# Patient Record
Sex: Male | Born: 1950 | Race: Black or African American | Hispanic: No | Marital: Married | Smoking: Current every day smoker
Health system: Southern US, Community
[De-identification: ages and names within clinical notes are randomized; demographics above are authoritative.]

---

## 2002-03-04 ENCOUNTER — Encounter: Payer: Self-pay | Admitting: Internal Medicine

## 2002-03-04 ENCOUNTER — Emergency Department (HOSPITAL_COMMUNITY): Admission: EM | Admit: 2002-03-04 | Discharge: 2002-03-04 | Payer: Self-pay | Admitting: Internal Medicine

## 2006-02-05 ENCOUNTER — Emergency Department (HOSPITAL_COMMUNITY): Admission: EM | Admit: 2006-02-05 | Discharge: 2006-02-05 | Payer: Self-pay | Admitting: Emergency Medicine

## 2015-03-22 ENCOUNTER — Emergency Department (HOSPITAL_COMMUNITY)
Admission: EM | Admit: 2015-03-22 | Discharge: 2015-03-22 | Disposition: A | Discharge status: Home / Self Care | Payer: Self-pay | Attending: Emergency Medicine | Admitting: Emergency Medicine

## 2015-03-22 ENCOUNTER — Encounter (HOSPITAL_COMMUNITY): Payer: Self-pay | Admitting: Emergency Medicine

## 2015-03-22 DIAGNOSIS — R0981 Nasal congestion: Secondary | ICD-10-CM | POA: Insufficient documentation

## 2015-03-22 DIAGNOSIS — R6889 Other general symptoms and signs: Secondary | ICD-10-CM

## 2015-03-22 DIAGNOSIS — R197 Diarrhea, unspecified: Secondary | ICD-10-CM | POA: Insufficient documentation

## 2015-03-22 DIAGNOSIS — M545 Low back pain, unspecified: Secondary | ICD-10-CM

## 2015-03-22 DIAGNOSIS — R109 Unspecified abdominal pain: Secondary | ICD-10-CM | POA: Insufficient documentation

## 2015-03-22 DIAGNOSIS — R05 Cough: Secondary | ICD-10-CM | POA: Insufficient documentation

## 2015-03-22 DIAGNOSIS — R509 Fever, unspecified: Secondary | ICD-10-CM | POA: Insufficient documentation

## 2015-03-22 DIAGNOSIS — R112 Nausea with vomiting, unspecified: Secondary | ICD-10-CM | POA: Insufficient documentation

## 2015-03-22 DIAGNOSIS — F17210 Nicotine dependence, cigarettes, uncomplicated: Secondary | ICD-10-CM | POA: Insufficient documentation

## 2015-03-22 LAB — COMPREHENSIVE METABOLIC PANEL
ALT: 19 U/L (ref 17–63)
AST: 29 U/L (ref 15–41)
Albumin: 4.1 g/dL (ref 3.5–5.0)
Alkaline Phosphatase: 79 U/L (ref 38–126)
Anion gap: 8 (ref 5–15)
BUN: 14 mg/dL (ref 6–20)
CO2: 27 mmol/L (ref 22–32)
Calcium: 9 mg/dL (ref 8.9–10.3)
Chloride: 98 mmol/L — ABNORMAL LOW (ref 101–111)
Creatinine, Ser: 1.17 mg/dL (ref 0.61–1.24)
GFR calc Af Amer: >60 mL/min (ref >60)
GFR calc non Af Amer: >60 mL/min (ref >60)
Glucose, Bld: 101 mg/dL — ABNORMAL HIGH (ref 65–99)
Potassium: 4 mmol/L (ref 3.5–5.1)
Sodium: 133 mmol/L — ABNORMAL LOW (ref 135–145)
Total Bilirubin: 0.7 mg/dL (ref 0.3–1.2)
Total Protein: 7 g/dL (ref 6.5–8.1)

## 2015-03-22 LAB — CBC
HCT: 43 % (ref 39.0–52.0)
Hemoglobin: 14.4 g/dL (ref 13.0–17.0)
MCH: 31.5 pg (ref 26.0–34.0)
MCHC: 33.5 g/dL (ref 30.0–36.0)
MCV: 94.1 fL (ref 78.0–100.0)
Platelets: 148 10*3/uL — ABNORMAL LOW (ref 150–400)
RBC: 4.57 MIL/uL (ref 4.22–5.81)
RDW: 13 % (ref 11.5–15.5)
WBC: 3.9 10*3/uL — ABNORMAL LOW (ref 4.0–10.5)

## 2015-03-22 LAB — LIPASE, BLOOD: Lipase: 32 U/L (ref 11–51)

## 2015-03-22 LAB — URINALYSIS, ROUTINE W REFLEX MICROSCOPIC
Bilirubin Urine: NEGATIVE
Glucose, UA: NEGATIVE mg/dL
Hgb urine dipstick: NEGATIVE
Ketones, ur: 15 mg/dL — AB
Leukocytes, UA: NEGATIVE
Nitrite: NEGATIVE
Protein, ur: 30 mg/dL — AB
Specific Gravity, Urine: 1.015 (ref 1.005–1.030)
pH: 5.5 (ref 5.0–8.0)

## 2015-03-22 LAB — URINE MICROSCOPIC-ADD ON: RBC / HPF: NONE SEEN RBC/hpf (ref 0–5)

## 2015-03-22 MED ORDER — HYDROCODONE-ACETAMINOPHEN 5-325 MG PO TABS
1.0000 | ORAL_TABLET | Freq: Once | ORAL | Status: AC
  Administered 2015-03-22: 1 via ORAL
  Filled 2015-03-22: qty 1

## 2015-03-22 MED ORDER — NAPROXEN 500 MG PO TABS: 500.0000 mg | ORAL_TABLET | Freq: Two times a day (BID) | ORAL | Status: DC

## 2015-03-22 MED ORDER — HYDROCODONE-ACETAMINOPHEN 5-325 MG PO TABS: 1.0000 | ORAL_TABLET | Freq: Four times a day (QID) | ORAL | Status: DC | PRN

## 2015-03-22 NOTE — ED Provider Notes (Signed)
CSN: [id number]     Arrival date & time 03/22/15  1523 History   First MD Initiated Contact with Patient 03/22/15 2004     Chief Complaint  Patient presents with  . Flank Pain    right     (Consider location/radiation/quality/duration/timing/severity/associated sxs/prior Treatment) Patient is a 65 y.o. male presenting with flank pain. The history is provided by the patient.  Flank Pain Pertinent negatives include no chest pain, no abdominal pain and no headaches.   patient with complaint of right-sided flank pain and right lower back pain. Been going on for 4 days. Patient also recovering from a flulike illness where he had fever and chills the past few days cough nausea vomiting and some diarrhea. The flank pain and back pain is made worse with movement. No hematuria. No anterior abdominal pain. No blood in the bowel movements. No chest pain.  Patient works as a Scientist, water quality. In the past patient has had pain similar to this but not quite as intense. Thought it was just due to a muscle problem.  History reviewed. No pertinent past medical history. History reviewed. No pertinent past surgical history. History reviewed. No pertinent family history. Social History  Substance Use Topics  . Smoking status: Current Every Day Smoker -- 0.50 packs/day    Types: Cigarettes  . Smokeless tobacco: None  . Alcohol Use: 3.0 oz/week    3 Cans of beer, 2 Shots of liquor per week    Review of Systems  Constitutional: Positive for fever.  HENT: Positive for congestion.   Eyes: Negative for visual disturbance.  Respiratory: Positive for cough.   Cardiovascular: Negative for chest pain.  Gastrointestinal: Positive for nausea, vomiting and diarrhea. Negative for abdominal pain.  Genitourinary: Positive for flank pain. Negative for dysuria and hematuria.  Musculoskeletal: Positive for back pain. Negative for neck pain.  Skin: Negative for rash.  Neurological: Negative for headaches.  Hematological:  Does not bruise/bleed easily.  Psychiatric/Behavioral: Negative for confusion.      Allergies  Review of patient's allergies indicates no known allergies.  Home Medications   Prior to Admission medications   Medication Sig Start Date End Date Taking? Authorizing Provider  HYDROcodone-acetaminophen (NORCO/VICODIN) 5-325 MG tablet Take 1-2 tablets by mouth every 6 (six) hours as needed. 03/22/15   Vanetta Mulders, MD  naproxen (NAPROSYN) 500 MG tablet Take 1 tablet (500 mg total) by mouth 2 (two) times daily. 03/22/15   Vanetta Mulders, MD   BP 117/81 mmHg  Pulse 73  Temp(Src) 98.3 F (36.8 C) (Oral)  Resp 18  Ht 5\' 10"  (1.778 m)  Wt 74.844 kg  BMI 23.68 kg/m2  SpO2 99% Physical Exam  Constitutional: He is oriented to person, place, and time. He appears well-developed and well-nourished. No distress.  HENT:  Head: Normocephalic and atraumatic.  Mouth/Throat: Oropharynx is clear and moist.  Eyes: Conjunctivae and EOM are normal. Pupils are equal, round, and reactive to light.  Neck: Normal range of motion. Neck supple.  Cardiovascular: Normal rate, regular rhythm and normal heart sounds.   No murmur heard. Pulmonary/Chest: Effort normal and breath sounds normal.  Abdominal: Soft. Bowel sounds are normal. He exhibits no mass. There is no tenderness.  Musculoskeletal: Normal range of motion. He exhibits tenderness.  Tenderness to palpation in the right low lumbar area paraspinous lateral muscles. No midline tenderness.  Neurological: He is alert and oriented to person, place, and time. No cranial nerve deficit. He exhibits normal muscle tone. Coordination normal.  Skin: Skin  is warm. No rash noted.    ED Course  Procedures (including critical care time) Labs Review Labs Reviewed  COMPREHENSIVE METABOLIC PANEL - Abnormal; Notable for the following:    Sodium 133 (*)    Chloride 98 (*)    Glucose, Bld 101 (*)    All other components within normal limits  CBC - Abnormal;  Notable for the following:    WBC 3.9 (*)    Platelets 148 (*)    All other components within normal limits  URINALYSIS, ROUTINE W REFLEX MICROSCOPIC (NOT AT [facility]) - Abnormal; Notable for the following:    Ketones, ur 15 (*)    Protein, ur 30 (*)    All other components within normal limits  URINE MICROSCOPIC-ADD ON - Abnormal; Notable for the following:    Squamous Epithelial / LPF 0-5 (*)    Bacteria, UA RARE (*)    All other components within normal limits  LIPASE, BLOOD   Results for orders placed or performed during the hospital encounter of 03/22/15  Lipase, blood  Result Value Ref Range   Lipase 32 11 - 51 U/L  Comprehensive metabolic panel  Result Value Ref Range   Sodium 133 (L) 135 - 145 mmol/L   Potassium 4.0 3.5 - 5.1 mmol/L   Chloride 98 (L) 101 - 111 mmol/L   CO2 27 22 - 32 mmol/L   Glucose, Bld 101 (H) 65 - 99 mg/dL   BUN 14 6 - 20 mg/dL   Creatinine, Ser 1.61 0.61 - 1.24 mg/dL   Calcium 9.0 8.9 - 09.6 mg/dL   Total Protein 7.0 6.5 - 8.1 g/dL   Albumin 4.1 3.5 - 5.0 g/dL   AST 29 15 - 41 U/L   ALT 19 17 - 63 U/L   Alkaline Phosphatase 79 38 - 126 U/L   Total Bilirubin 0.7 0.3 - 1.2 mg/dL   GFR calc non Af Amer >60 >60 mL/min   GFR calc Af Amer >60 >60 mL/min   Anion gap 8 5 - 15  CBC  Result Value Ref Range   WBC 3.9 (L) 4.0 - 10.5 K/uL   RBC 4.57 4.22 - 5.81 MIL/uL   Hemoglobin 14.4 13.0 - 17.0 g/dL   HCT 04.5 40.9 - 81.1 %   MCV 94.1 78.0 - 100.0 fL   MCH 31.5 26.0 - 34.0 pg   MCHC 33.5 30.0 - 36.0 g/dL   RDW 91.4 78.2 - 95.6 %   Platelets 148 (L) 150 - 400 K/uL  Urinalysis, Routine w reflex microscopic (not at [facility])  Result Value Ref Range   Color, Urine YELLOW YELLOW   APPearance CLEAR CLEAR   Specific Gravity, Urine 1.015 1.005 - 1.030   pH 5.5 5.0 - 8.0   Glucose, UA NEGATIVE NEGATIVE mg/dL   Hgb urine dipstick NEGATIVE NEGATIVE   Bilirubin Urine NEGATIVE NEGATIVE   Ketones, ur 15 (A) NEGATIVE mg/dL   Protein, ur 30 (A) NEGATIVE mg/dL    Nitrite NEGATIVE NEGATIVE   Leukocytes, UA NEGATIVE NEGATIVE  Urine microscopic-add on  Result Value Ref Range   Squamous Epithelial / LPF 0-5 (A) NONE SEEN   WBC, UA 0-5 0 - 5 WBC/hpf   RBC / HPF NONE SEEN 0 - 5 RBC/hpf   Bacteria, UA RARE (A) NONE SEEN   Urine-Other MUCOUS PRESENT     Imaging Review No results found. I have personally reviewed and evaluated these images and lab results as part of my medical decision-making.  EKG Interpretation None      MDM   Final diagnoses:  Right-sided low back pain without sciatica  Flu-like symptoms  Flank pain   patient with complaint of right lower back pain muscular area and somewhat right flank. Worse with movement consistent with musculoskeletal pain. Patient's labs without significant abnormalities. No evidence urinary tract infection or hematuria. This does not seem to be consistent with a kidney stone at all. Patient had a flulike illness this week associated with some vomiting and diarrhea and cough. Was having fevers up to yesterday now resolved. Patient has had pain in this area before but he usually has resolved. 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  Patient nontoxic no acute distress no hypoxia no fevers here patient can be treated symptomatically.   Vanetta Mulders, MD 03/22/15 2040

## 2015-03-22 NOTE — ED Notes (Signed)
C/o pain to right flank area for 4 days.  Have not been seen by MD in over 30 years.  Having difficulty walking, c/o cough (non-productive), headache for 2 days.

## 2015-03-22 NOTE — ED Notes (Signed)
Pt states understanding of care given and follow up instructions.  Ambulated from ED.  Family member to drive pt home.  Instructed not to drive or drink ETOH while on prescription pain medications

## 2015-03-22 NOTE — ED Notes (Signed)
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  However, pt states the pain is worse this time even though it is the same type of pain as before.

## 2015-03-22 NOTE — Discharge Instructions (Signed)
Take the Naprosyn on a regular basis. For the muscle soreness. Supplement with the hydrocodone particularly at night for additional pain relief. Work note provided. Resource guide provided to help you find a regular doctor. Return for any new or worse symptoms.    Emergency Department Resource Guide 1) Find a Doctor and Pay Out of Pocket Although you won't have to find out who is covered by your insurance plan, it is a good idea to ask around and get recommendations. You will then need to call the office and see if the doctor you have chosen will accept you as a new patient and what types of options they offer for patients who are self-pay. Some doctors offer discounts or will set up payment plans for their patients who do not have insurance, but you will need to ask so you aren't surprised when you get to your appointment.  2) Contact Your Local Health Department Not all health departments have doctors that can see patients for sick visits, but many do, so it is worth a call to see if yours does. If you don't know where your local health department is, you can check in your phone book. The CDC also has a tool to help you locate your state's health department, and many state websites also have listings of all of their local health departments.  3) Find a Walk-in Clinic If your illness is not likely to be very severe or complicated, you may want to try a walk in clinic. These are popping up all over the country in pharmacies, drugstores, and shopping centers. They're usually staffed by nurse practitioners or physician assistants that have been trained to treat common illnesses and complaints. They're usually fairly quick and inexpensive. However, if you have serious medical issues or chronic medical problems, these are probably not your best option.  No Primary Care Doctor: - Call Health Connect at  [phone] - they can help you locate a primary care doctor that  accepts your insurance, provides certain  services, etc. - Physician Referral Service- [phone]  Chronic Pain Problems: Organization         Address  Phone   Notes  [facility]  [phone] Patients need to be referred by their primary care doctor.   Medication Assistance: Organization         Address  Phone   Notes  [facility] Medication [facility] [address] [place] [place], [place] [address] [phone] --Must be a resident of [place] -- Must have NO insurance coverage whatsoever (no Medicaid/ Medicare, etc.) -- The pt. MUST have a primary care doctor that directs their care regularly and follows them in the community   [facility]ist  [phone]   [employer]  [phone]    Agencies that provide inexpensive medical care: Organization         Address  Phone   Notes  [facility]  [phone]   [facility]    [phone]   [facility] [address] [place], [place] [address] [phone]   [facility] [address] [place] [place] [phone]   Planned Parenthood    [phone]   [facility]    [phone]   [facility]  [address], [place] Phone:  [phone], Fax:  [phone] Hours of Operation:  9 am - 6 pm, M-F.  Also  accepts Medicaid/Medicare and self-pay.  [facility] for [place], [address], [place] Phone: [phone], Fax: [phone]. Hours of Operation:  8:30 am - 5:30 pm, M-F.  Also accepts Medicaid and self-pay.  [facility] [address], [place] Phone: [phone]   [place], [place], [place] [phone], Ext. [phone] Mondays & Thursdays: 7-9 AM.  First 15 patients are seen on a first come, first serve basis.    [place] Providers:  Organization         Address  Phone   Notes  [facility] [address], [place] [phone] Also accepts self-pay patients.  [facility] [id number] [place], [place]  [phone]   [place], [address], [place] [phone]   [facility] [address] [place] [phone]   Lucianne Lei [address], [place]   [phone] Only accepts [place] Access [place] patients after they have their name applied to their card.   Self-Pay (no insurance) in [facility]:  Organization         Address  Phone   Notes  Sickle Cell Patients, [facility] [place] [phone]   [facility] [place] [phone]   [facility] [place]  [place], [place], [place] [phone]   [facility]/Dr. Osei-Bonsu  [address], [place] or [address], [place] [phone] Phone number for both [place] and [place] locations is the same.  [facility] [address] [place] [phone]   [facility] [address], [place] or [address] [phone]   [facility] [address], [place] [phone], phone; [phone], fax Sees patients 1st and 3rd Saturday of every month.  Must not qualify for public or private insurance (i.e. Medicaid, Medicare, [place] Health Choice, Veterans' Benefits)  Household income should be no more than 200% of the poverty level The clinic cannot treat you if you are pregnant or think you are pregnant  Sexually transmitted diseases are not treated at the clinic.    Dental Care: Organization         Address  Phone  Notes  [facility] [phone] Accepts  children up to age 58 who are enrolled in [place] or [place]; pregnant women with a Medicaid card; and children who have applied for Medicaid or [place] Health Choice, but were declined, whose parents can pay a reduced fee at time of service.  [facility]  [address], [place] [phone] Accepts children up to age 73 who are enrolled in [place] or [place]; pregnant women with a Medicaid card; and children who have applied for Medicaid or [place] Health Choice, but were declined, whose parents can pay a reduced fee at time of service.  [facility] Access PRO[facility]  [place] [phone] Patients are seen by appointment only. Walk-ins are not accepted. [place] will see patients 13 years of age and older. Monday - Tuesday (8am-5pm)  Most Wednesdays (8:30-5pm) $30 per visit, cash only  [facility] Adult [employer] PROGRAM  [address], [facility] [phone] Patients are seen by appointment only. Walk-ins are not accepted. [place] will see patients 76 years of age and older. One Wednesday Evening (Monthly: Volunteer Based).  $30 per visit, cash only  [place]  [phone] for adults; Children under age 76, call Graduate Pediatric Dentistry at [phone]. Children aged 3-14, please call [phone] to request a pediatric application.  Dental services are provided in all areas of dental care including fillings, crowns and bridges, complete and partial dentures, implants, gum treatment, root canals, and extractions. Preventive care is also provided. Treatment is provided to both adults and children. Patients are selected via a lottery and there is often a waiting list.   [facility] [address] [place]  [phone] [email or web address]   [facility] [address] [place], [place] [phone], Ext. [phone] Second and  Fourth Thursday of each month, opens at 6:30 AM; Clinic ends at 9 AM.  Patients are seen on a first-come first-served basis, and a limited number are seen during each clinic.   [facility]  [address] Hillard Danker [place], [place] [phone]   Eligibility Requirements You must have lived in [place], [place], or [place] counties for at least the last three months.   You cannot be eligible for state or federal sponsored [employer], including [employer], [place], or [employer].   You generally cannot be eligible for healthcare insurance through your employer.    How to apply: Eligibility screenings are held every Tuesday and Wednesday afternoon from 1:00 pm until 4:00 pm. You do not need an appointment for the interview!  [facility] [address], [place], [place]   [place]  [place] Department  Industry  [phone]    Behavioral Health Resources in the Community: Intensive Outpatient Programs Organization         Address  Phone  Notes  [place] Ruth. [address], [place], [place] [phone]   [facility] Outpatient [address], [place], [place]   ADS: Alcohol & Drug Svcs [address], [place], [place]   [place] [address]  [place], [place] or [phone]   Substance Abuse Resources Organization         Address  Phone  Notes  Alcohol and Drug Services  [phone]   [place]  [phone]   [facility] [place]   Chinita Pester  [phone]   Residential & Outpatient Substance Abuse Program  [phone]   Psychological Services Organization         Address  Phone  Notes  [facility] [place]  [place]  [phone]   [place]  [address], [place] or [phone]    Mobile Crisis Teams Organization         Address  Phone  Notes  [facility], Mobile Crisis Care Unit  [phone]   [facility]  [address] [place], [place]   [place] Levels [address] [place] [phone]    Self-Help/Support Groups Organization         Address  Phone  Notes  Mental Health Assoc. of [place] - variety of support groups  [place] Call for more information  Narcotics Anonymous (NA), [facility] [address], [employer] [place]  2 meetings at this location   Special educational needs teacher         Address  Phone  Notes  ASAP Residential Treatment [place],    [place]  [phone]   [facility]  [address] [place] [id number], [place], [place]   [place], [place] [phone] Admissions: 8am-3pm M-F  Incentives Substance [place] [address]    [place], [place] [phone]   [facility] [address] [place], [place], [place]   [facility] [address],  [place], [place]   [facility] Programs - Intensive Outpatient [address] Kristeen [address], [place], [place]   [facility] [place].,  [place], [place] [phone] or [phone]   [facility] Services (RTS) [address] [place], [place] Accepts Medicaid  [facility] [employer] [address],  [place] [phone] Substance Abuse/Addiction Treatment   [facility] Organization         Address  Phone  Notes  [facility]  [phone]   Domenic Schwab, PhD [address] Arlis Porta [place], [place]   [phone] or [phone]   [place], [place] [phone]   [facility] [address] [place], [place] [phone] Insurance/Medicaid/sponsorship through [facility] and Families [address], [address] [place]                                    [place], [place] [phone] [place] [address], [place] [phone]    Dr. Adele Schilder  [phone]   [facility] Dept. 1) [address] [place] 2) [place] 3)  [place] 65, [place] [phone]  [phone]   [place] [phone] or [phone] (After Hours)

## 2016-03-25 ENCOUNTER — Emergency Department (HOSPITAL_COMMUNITY)
Admission: EM | Admit: 2016-03-25 | Discharge: 2016-03-25 | Disposition: A | Discharge status: Home / Self Care | Payer: Self-pay | Attending: Emergency Medicine | Admitting: Emergency Medicine

## 2016-03-25 ENCOUNTER — Encounter (HOSPITAL_COMMUNITY): Payer: Self-pay | Admitting: Emergency Medicine

## 2016-03-25 DIAGNOSIS — F17210 Nicotine dependence, cigarettes, uncomplicated: Secondary | ICD-10-CM | POA: Insufficient documentation

## 2016-03-25 DIAGNOSIS — W268XXA Contact with other sharp object(s), not elsewhere classified, initial encounter: Secondary | ICD-10-CM | POA: Insufficient documentation

## 2016-03-25 DIAGNOSIS — Y9389 Activity, other specified: Secondary | ICD-10-CM | POA: Insufficient documentation

## 2016-03-25 DIAGNOSIS — Y999 Unspecified external cause status: Secondary | ICD-10-CM | POA: Insufficient documentation

## 2016-03-25 DIAGNOSIS — S61501A Unspecified open wound of right wrist, initial encounter: Secondary | ICD-10-CM

## 2016-03-25 DIAGNOSIS — S61511A Laceration without foreign body of right wrist, initial encounter: Secondary | ICD-10-CM

## 2016-03-25 DIAGNOSIS — Y929 Unspecified place or not applicable: Secondary | ICD-10-CM | POA: Insufficient documentation

## 2016-03-25 DIAGNOSIS — Z23 Encounter for immunization: Secondary | ICD-10-CM | POA: Insufficient documentation

## 2016-03-25 DIAGNOSIS — S66129A Laceration of flexor muscle, fascia and tendon of unspecified finger at wrist and hand level, initial encounter: Secondary | ICD-10-CM | POA: Insufficient documentation

## 2016-03-25 DIAGNOSIS — S66921A Laceration of unspecified muscle, fascia and tendon at wrist and hand level, right hand, initial encounter: Secondary | ICD-10-CM

## 2016-03-25 MED ORDER — POVIDONE-IODINE 10 % EX SOLN
CUTANEOUS | Status: AC
  Filled 2016-03-25: qty 118

## 2016-03-25 MED ORDER — BACITRACIN ZINC 500 UNIT/GM EX OINT
TOPICAL_OINTMENT | CUTANEOUS | Status: AC
  Filled 2016-03-25: qty 0.9

## 2016-03-25 MED ORDER — TETANUS-DIPHTH-ACELL PERTUSSIS 5-2.5-18.5 LF-MCG/0.5 IM SUSP
0.5000 mL | Freq: Once | INTRAMUSCULAR | Status: AC
  Administered 2016-03-25: 0.5 mL via INTRAMUSCULAR
  Filled 2016-03-25: qty 0.5

## 2016-03-25 MED ORDER — LIDOCAINE HCL (PF) 1 % IJ SOLN
5.0000 mL | Freq: Once | INTRAMUSCULAR | Status: AC
  Administered 2016-03-25: 5 mL

## 2016-03-25 MED ORDER — LIDOCAINE HCL (PF) 1 % IJ SOLN
INTRAMUSCULAR | Status: AC
  Filled 2016-03-25: qty 5

## 2016-03-25 NOTE — Discharge Instructions (Signed)
Keep the bandage on until you see the orthopedic doctor in 2 or 3 days  For pain, take Tylenol.

## 2016-03-25 NOTE — ED Triage Notes (Signed)
Patient states he was loading an old dryer on a truck and it slipped and cut his right forearm. Bleeding under control.

## 2016-03-25 NOTE — ED Provider Notes (Signed)
[facility]-EM[facility]Y DEPT Provider Note   CSN: [id number] Arrival date & time: 03/25/16  1512   By signing my name below, I, [name], attest that this documentation has been prepared under the direction and in the presence of Mancel BaleElliott Nicklas Mcsweeney, MD. Electronically Signed: [name], Scribe. 03/25/16. 8:49 PM. History   Chief Complaint Chief Complaint  Patient presents with  . Laceration     The history is provided by the patient. No language interpreter was used.  HPI Comments: Maurice Wells is a 66 y.o. male who presents to the Emergency Department complaining of a right wrist laceration that occurred around 2 pm today. The patient states that he was loading a dryer on a truck when the dryer slipped and cut his arm. He was able to get the bleeding under control. He reports some pain around the area of the laceration. He states that his last tetanus shot was at least 10 years ago but he is unsure of the exact date.  History reviewed. No pertinent past medical history.  There are no active problems to display for this patient.   History reviewed. No pertinent surgical history.     Home Medications    Prior to Admission medications   Not on File    Family History No family history on file.  Social History Social History  Substance Use Topics  . Smoking status: Current Every Day Smoker    Packs/day: 0.50    Types: Cigarettes  . Smokeless tobacco: Never Used  . Alcohol use 3.0 oz/week    3 Cans of beer, 2 Shots of liquor per week     Allergies   Patient has no known allergies.   Review of Systems Review of Systems  Skin: Positive for wound.  Neurological: Negative for weakness and numbness.  All other systems reviewed and are negative.    Physical Exam Updated Vital Signs BP 138/98 (BP Location: Left Arm)   Pulse 66   Temp 97.9 F (36.6 C) (Oral)   Resp 18   Ht 5\' 10"  (1.778 m)   Wt 155 lb (70.3 kg)   SpO2 100%   BMI 22.24 kg/m   Physical  Exam  Constitutional: He is oriented to person, place, and time. He appears well-developed and well-nourished.  HENT:  Head: Normocephalic and atraumatic.  Right Ear: External ear normal.  Left Ear: External ear normal.  Eyes: Conjunctivae and EOM are normal. Pupils are equal, round, and reactive to light.  Neck: Normal range of motion and phonation normal. Neck supple.  Musculoskeletal: Normal range of motion.   Assessment of neuro vascular and functional status of the right hand as follows; normal sensation in all fingers of the right hand.  Normal capillary refill in the fingers of the right hand.  Intact flexion strength of all fingers of the right hand.  Wrist flexion functional but weaker on the right than the left.  Neurological: He is alert and oriented to person, place, and time. No cranial nerve deficit or sensory deficit. He exhibits normal muscle tone. Coordination normal.  Skin: Skin is warm, dry and intact.  Laceration of the right volar forearm just proximal to the wrist with a visible severed tendon in the wound.  Psychiatric: He has a normal mood and affect. His behavior is normal. Judgment and thought content normal.  Nursing note and vitals reviewed.   ED Treatments / Results  DIAGNOSTIC STUDIES: Oxygen Saturation is 100% on RA, normal by my interpretation.    COORDINATION  OF CARE: 8:31 PM Discussed treatment plan with pt at bedside, which includes the repair of his laceration,  and pt agreed to plan.  Labs (all labs ordered are listed, but only abnormal results are displayed) Labs Reviewed - No data to display  EKG  EKG Interpretation None       Radiology No results found.  Procedures .Marland KitchenLaceration Repair Date/Time: 03/25/2016 9:34 PM Performed by: Mancel Bale Authorized by: Mancel Bale   Consent:    Consent obtained:  Verbal   Risks discussed:  Pain, need for additional repair and tendon damage   Alternatives discussed:  No treatment and delayed  treatment Anesthesia (see MAR for exact dosages):    Anesthesia method:  Local infiltration   Local anesthetic:  Lidocaine 1% w/o epi Laceration details:    Location: right wrist.   Length (cm):  3.5   Depth (mm):  1 Repair type:    Repair type:  Simple Pre-procedure details:    Preparation:  Patient was prepped and draped in usual sterile fashion Exploration:    Hemostasis achieved with:  Direct pressure   Wound exploration: wound explored through full range of motion and entire depth of wound probed and visualized     Wound extent: tendon damage     Tendon damage location:  Upper extremity (volar, ulnar wrist right)   Tendon damage extent:  Complete transection   Tendon repair plan:  Refer for evaluation   Contaminated: no   Treatment:    Area cleansed with:  Betadine   Amount of cleaning:  Standard   Irrigation solution:  Sterile saline   Irrigation method:  Syringe   Visualized foreign bodies/material removed: no   Skin repair:    Repair method:  Sutures   Suture size:  3-0   Suture material:  Prolene   Suture technique:  Simple interrupted   Number of sutures:  3 Approximation:    Approximation:  Loose   Vermilion border: well-aligned   Post-procedure details:    Dressing:  Antibiotic ointment, non-adherent dressing and bulky dressing   Patient tolerance of procedure:  Tolerated well, no immediate complications    (including critical care time)  Medications Ordered in ED Medications  povidone-iodine (BETADINE) 10 % external solution (not administered)  lidocaine (PF) (XYLOCAINE) 1 % injection 5 mL (5 mLs Infiltration Given 03/25/16 2039)  Tdap (BOOSTRIX) injection 0.5 mL (0.5 mLs Intramuscular Given 03/25/16 2042)     Initial Impression / Assessment and Plan / ED Course  I have reviewed the triage vital signs and the nursing notes.  Pertinent labs & imaging results that were available during my care of the patient were reviewed by me and considered in my  medical decision making (see chart for details).  Clinical Course as of Mar 25 2132  Tue Mar 25, 2016  2132 Case was discussed with Dr. Janee Morn, hand surgeon in [facility].  We discussed the likelihood of a flexor carpi ulnaris tendon laceration without associated nerve or vascular injury.  Dr. Janee Morn states that there is no urgent requirement for repair of an FCU tendon disruption.  He recommends skin closure, and follow-up with a local orthopedic doctor, and referral to hand surgery if needed, after further evaluation.  [EW]  2133 The case was discussed with orthopedist Dr. Romeo Apple, who states that he will follow-up with the patient in the office in a couple of days.  We discussed the interaction that I had with Dr. Janee Morn.  [EW]    Clinical  Course User Index [EW] Mancel Bale, MD    Medications  povidone-iodine (BETADINE) 10 % external solution (not administered)  bacitracin 500 UNIT/GM ointment (not administered)  lidocaine (PF) (XYLOCAINE) 1 % injection 5 mL (5 mLs Infiltration Given 03/25/16 2039)  Tdap (BOOSTRIX) injection 0.5 mL (0.5 mLs Intramuscular Given 03/25/16 2042)    Patient Vitals for the past 24 hrs:  BP Temp Temp src Pulse Resp SpO2 Height Weight  03/25/16 1906 138/98 97.9 F (36.6 C) Oral 66 18 100 % - -  03/25/16 1558 114/70 98.4 F (36.9 C) Oral 72 16 100 % 5\' 10"  (1.778 m) 155 lb (70.3 kg)    9:40 PM Reevaluation with update and discussion. After initial assessment and treatment, an updated evaluation reveals no change in clinical status.  Findings discussed with patient and all questions were answered. Marcie Shearon L    Final Clinical Impressions(s) / ED Diagnoses   Final diagnoses:  Wrist laceration, right, initial encounter  Flexor tendon laceration of right wrist with open wound, initial encounter   Wrist laceration with tendon injury and present neurovascular functioning.  Somewhat weak right wrist flexion associated with an apparent  severed flexor carpi ulnaris tendon.  Skin wound closed, with out repairing the tendon injury.  Patient referred for specialty follow-up, with orthopedics after telephone consultation with hand surgery.  Nursing Notes Reviewed/ Care Coordinated Applicable Imaging Reviewed Interpretation of Laboratory Data incorporated into ED treatment  The patient appears reasonably screened and/or stabilized for discharge and I doubt any other medical condition or other [facility] requiring further screening, evaluation, or treatment in the ED at this time prior to discharge.  Plan: Home Medications-Tylenol or Motrin for pain; Home Treatments-leave bandage on until recheck; return here if the recommended treatment, does not improve the symptoms; Recommended follow up-orthopedic follow-up in 2 days.   New Prescriptions New Prescriptions   No medications on file   I personally performed the services described in this documentation, which was scribed in my presence. The recorded information has been reviewed and is accurate.     Mancel Bale, MD 03/26/16 1226

## 2016-03-28 ENCOUNTER — Ambulatory Visit: Payer: Self-pay | Admitting: Orthopedic Surgery

## 2016-03-28 ENCOUNTER — Telehealth: Payer: Self-pay | Admitting: Orthopedic Surgery

## 2016-03-28 NOTE — Telephone Encounter (Signed)
Mr. Maurice Wells called and left a message on the voicemail stating that he could not come up with any money. Before I could call him back he walks into the office. I explained to him that he would need to pay something. He said that he could not come up with any money and he wanted to reschedule his appointment. I told him to contact [facility] and talk with her to see if he qualifies for the [facility], I gave him her name and number and he said he would give her a call. He said that his arm looked good, but wanted to be seen as soon as possible. He is scheduled to come in on Tuesday 2/27. He said that maybe he could come up with the money by then.

## 2016-04-01 ENCOUNTER — Ambulatory Visit (INDEPENDENT_AMBULATORY_CARE_PROVIDER_SITE_OTHER): Payer: Self-pay | Admitting: Orthopedic Surgery

## 2016-04-01 ENCOUNTER — Encounter: Payer: Self-pay | Admitting: Orthopedic Surgery

## 2016-04-01 VITALS — BP 112/71 | HR 65 | Wt 155.0 lb

## 2016-04-01 DIAGNOSIS — S41111A Laceration without foreign body of right upper arm, initial encounter: Secondary | ICD-10-CM

## 2016-04-01 NOTE — Progress Notes (Signed)
New patient visit  Follow-up from the emergency room  The patient sustained a laceration on the volar aspect of his right wrist it was sewn up in the emergency room  I discussed this with the emergency room physician who discussed it with the hand surgeon who was on-call and they recommended that no surgical treatment was needed just wound care so we agreed to follow him up  He has no numbness or tingling in the hand  He can flex and extend the wrist normally the laceration looks normal there are 3 sutures in it  Sensory exam is normal  Recommend removal of sutures post injury date 14

## 2016-04-08 ENCOUNTER — Ambulatory Visit: Payer: Self-pay | Admitting: Orthopedic Surgery

## 2016-04-09 ENCOUNTER — Encounter: Payer: Self-pay | Admitting: Orthopedic Surgery

## 2016-04-14 ENCOUNTER — Ambulatory Visit (INDEPENDENT_AMBULATORY_CARE_PROVIDER_SITE_OTHER): Payer: Self-pay | Admitting: Orthopedic Surgery

## 2016-04-14 DIAGNOSIS — S41111A Laceration without foreign body of right upper arm, initial encounter: Secondary | ICD-10-CM

## 2016-04-14 NOTE — Progress Notes (Signed)
FOLLOW UP VISIT   Patient ID: Maurice Wells, male   DOB: [birth year], 66 y.o.   MRN: [id number]  Chief Complaint  Patient presents with  . Follow-up    Recheck on wrist laceration.    HPI Maurice Wells is a 66 y.o. male.   HPI  66 year old male status post laceration of his FCU on the left wrist. He is doing well no weakness or numbness  Review of Systems Review of Systems     Physical Exam  Flexes the wrist normally on the fingers move normally color and sensation normal to the left hand   MEDICAL DECISION MAKING  DATA     DIAGNOSIS  Encounter Diagnosis  Name Primary?  . Laceration of right upper extremity, initial encounter Yes     PLAN(RISK)    Released from care warnings given for wound drainage or any signs of infection to call back

## 2019-05-20 ENCOUNTER — Ambulatory Visit: Payer: Medicare Other | Attending: Internal Medicine

## 2019-05-20 DIAGNOSIS — Z23 Encounter for immunization: Secondary | ICD-10-CM

## 2019-05-20 NOTE — Progress Notes (Signed)
   Covid-19 Vaccination Clinic  Name:  Maurice Wells    MRN: [id number] DOB: [birth year]  05/20/2019  [name] was observed post Covid-19 immunization for 15 minutes without incident. He was provided with Vaccine Information Sheet and instruction to access the V-Safe system.   Mr. Rise was instructed to call 911 with any severe reactions post vaccine: Marland Kitchen Difficulty breathing  . Swelling of face and throat  . A fast heartbeat  . A bad rash all over body  . Dizziness and weakness   Immunizations Administered    Name Date Dose VIS Date Route   Moderna COVID-19 Vaccine 05/20/2019 10:15 AM 0.5 mL 01/04/2019 Intramuscular   Manufacturer: Moderna   Lot: [id number]   NDC: 81448-185-63

## 2019-06-21 ENCOUNTER — Ambulatory Visit: Payer: Medicare Other | Attending: Internal Medicine

## 2019-06-21 DIAGNOSIS — Z23 Encounter for immunization: Secondary | ICD-10-CM

## 2019-06-21 NOTE — Progress Notes (Signed)
   Covid-19 Vaccination Clinic  Name:  Maurice Wells    MRN: [id number] DOB: October 29, 1950  06/21/2019  [name] was observed post Covid-19 immunization for 15 minutes without incident. He was provided with Vaccine Information Sheet and instruction to access the V-Safe system.   [name] was instructed to call 911 with any severe reactions post vaccine: Marland Kitchen Difficulty breathing  . Swelling of face and throat  . A fast heartbeat  . A bad rash all over body  . Dizziness and weakness   Immunizations Administered    Name Date Dose VIS Date Route   Moderna COVID-19 Vaccine 06/21/2019 10:48 AM 0.5 mL 01/2019 Intramuscular   Manufacturer: Moderna   Lot: [id number]   NDC: 60600-459-97

## 2019-12-01 ENCOUNTER — Other Ambulatory Visit: Payer: Self-pay

## 2019-12-01 ENCOUNTER — Encounter (HOSPITAL_COMMUNITY): Payer: Self-pay

## 2019-12-01 ENCOUNTER — Emergency Department (HOSPITAL_COMMUNITY): Payer: Medicare Other

## 2019-12-01 ENCOUNTER — Observation Stay (HOSPITAL_COMMUNITY)
Admission: EM | Admit: 2019-12-01 | Discharge: 2019-12-02 | Disposition: A | Discharge status: Home / Self Care | Payer: Medicare Other | Attending: Family Medicine | Admitting: Family Medicine

## 2019-12-01 DIAGNOSIS — F17210 Nicotine dependence, cigarettes, uncomplicated: Secondary | ICD-10-CM | POA: Diagnosis not present

## 2019-12-01 DIAGNOSIS — Z20822 Contact with and (suspected) exposure to covid-19: Secondary | ICD-10-CM | POA: Diagnosis not present

## 2019-12-01 DIAGNOSIS — R55 Syncope and collapse: Secondary | ICD-10-CM | POA: Diagnosis present

## 2019-12-01 DIAGNOSIS — I4891 Unspecified atrial fibrillation: Secondary | ICD-10-CM | POA: Diagnosis not present

## 2019-12-01 DIAGNOSIS — F1910 Other psychoactive substance abuse, uncomplicated: Secondary | ICD-10-CM

## 2019-12-01 LAB — TSH: TSH: 3.283 u[IU]/mL (ref 0.350–4.500)

## 2019-12-01 LAB — CBC
HCT: 43.7 % (ref 39.0–52.0)
Hemoglobin: 14.5 g/dL (ref 13.0–17.0)
MCH: 33.4 pg (ref 26.0–34.0)
MCHC: 33.2 g/dL (ref 30.0–36.0)
MCV: 100.7 fL — ABNORMAL HIGH (ref 80.0–100.0)
Platelets: 205 10*3/uL (ref 150–400)
RBC: 4.34 MIL/uL (ref 4.22–5.81)
RDW: 12.7 % (ref 11.5–15.5)
WBC: 9.3 10*3/uL (ref 4.0–10.5)
nRBC: 0 % (ref 0.0–0.2)

## 2019-12-01 LAB — COMPREHENSIVE METABOLIC PANEL
ALT: 17 U/L (ref 0–44)
ALT: 17 U/L (ref 0–44)
AST: 24 U/L (ref 15–41)
AST: 23 U/L (ref 15–41)
Albumin: 4 g/dL (ref 3.5–5.0)
Albumin: 3.9 g/dL (ref 3.5–5.0)
Alkaline Phosphatase: 63 U/L (ref 38–126)
Alkaline Phosphatase: 61 U/L (ref 38–126)
Anion gap: 8 (ref 5–15)
Anion gap: 12 (ref 5–15)
BUN: 12 mg/dL (ref 8–23)
BUN: 10 mg/dL (ref 8–23)
CO2: 27 mmol/L (ref 22–32)
CO2: 25 mmol/L (ref 22–32)
Calcium: 8.9 mg/dL (ref 8.9–10.3)
Calcium: 9.2 mg/dL (ref 8.9–10.3)
Chloride: 103 mmol/L (ref 98–111)
Chloride: 103 mmol/L (ref 98–111)
Creatinine, Ser: 1.11 mg/dL (ref 0.61–1.24)
Creatinine, Ser: 0.86 mg/dL (ref 0.61–1.24)
GFR, Estimated: >60 mL/min (ref >60)
GFR, Estimated: >60 mL/min (ref >60)
Glucose, Bld: 114 mg/dL — ABNORMAL HIGH (ref 70–99)
Glucose, Bld: 128 mg/dL — ABNORMAL HIGH (ref 70–99)
Potassium: 4 mmol/L (ref 3.5–5.1)
Potassium: 3.6 mmol/L (ref 3.5–5.1)
Sodium: 138 mmol/L (ref 135–145)
Sodium: 140 mmol/L (ref 135–145)
Total Bilirubin: 0.7 mg/dL (ref 0.3–1.2)
Total Bilirubin: 0.6 mg/dL (ref 0.3–1.2)
Total Protein: 6.6 g/dL (ref 6.5–8.1)
Total Protein: 6.7 g/dL (ref 6.5–8.1)

## 2019-12-01 LAB — URINALYSIS, ROUTINE W REFLEX MICROSCOPIC
Bilirubin Urine: NEGATIVE
Glucose, UA: NEGATIVE mg/dL
Hgb urine dipstick: NEGATIVE
Ketones, ur: NEGATIVE mg/dL
Leukocytes,Ua: NEGATIVE
Nitrite: NEGATIVE
Protein, ur: NEGATIVE mg/dL
Specific Gravity, Urine: 1.011 (ref 1.005–1.030)
pH: 7 (ref 5.0–8.0)

## 2019-12-01 LAB — RAPID URINE DRUG SCREEN, HOSP PERFORMED
Amphetamines: NOT DETECTED
Barbiturates: NOT DETECTED
Benzodiazepines: NOT DETECTED
Cocaine: POSITIVE — AB
Opiates: NOT DETECTED
Tetrahydrocannabinol: POSITIVE — AB

## 2019-12-01 LAB — CBC WITH DIFFERENTIAL/PLATELET
Abs Immature Granulocytes: 0.02 10*3/uL (ref 0.00–0.07)
Basophils Absolute: 0 10*3/uL (ref 0.0–0.1)
Basophils Relative: 0 %
Eosinophils Absolute: 0.1 10*3/uL (ref 0.0–0.5)
Eosinophils Relative: 2 %
HCT: 41.7 % (ref 39.0–52.0)
Hemoglobin: 14 g/dL (ref 13.0–17.0)
Immature Granulocytes: 0 %
Lymphocytes Relative: 28 %
Lymphs Abs: 1.8 10*3/uL (ref 0.7–4.0)
MCH: 33.8 pg (ref 26.0–34.0)
MCHC: 33.6 g/dL (ref 30.0–36.0)
MCV: 100.7 fL — ABNORMAL HIGH (ref 80.0–100.0)
Monocytes Absolute: 0.5 10*3/uL (ref 0.1–1.0)
Monocytes Relative: 7 %
Neutro Abs: 4 10*3/uL (ref 1.7–7.7)
Neutrophils Relative %: 63 %
Platelets: 192 10*3/uL (ref 150–400)
RBC: 4.14 MIL/uL — ABNORMAL LOW (ref 4.22–5.81)
RDW: 12.8 % (ref 11.5–15.5)
WBC: 6.4 10*3/uL (ref 4.0–10.5)
nRBC: 0 % (ref 0.0–0.2)

## 2019-12-01 LAB — CK: Total CK: 167 U/L (ref 49–397)

## 2019-12-01 LAB — RESPIRATORY PANEL BY RT PCR (FLU A&B, COVID)
Influenza A by PCR: NEGATIVE
Influenza B by PCR: NEGATIVE
SARS Coronavirus 2 by RT PCR: NEGATIVE

## 2019-12-01 LAB — AMMONIA: Ammonia: 10 umol/L (ref 9–35)

## 2019-12-01 LAB — TROPONIN I (HIGH SENSITIVITY)
Troponin I (High Sensitivity): 3 ng/L (ref <18)
Troponin I (High Sensitivity): 13 ng/L (ref <18)

## 2019-12-01 LAB — LACTIC ACID, PLASMA: Lactic Acid, Venous: 1.5 mmol/L (ref 0.5–1.9)

## 2019-12-01 LAB — PHOSPHORUS: Phosphorus: 3 mg/dL (ref 2.5–4.6)

## 2019-12-01 LAB — ETHANOL: Alcohol, Ethyl (B): 63 mg/dL — ABNORMAL HIGH (ref <10)

## 2019-12-01 LAB — MAGNESIUM: Magnesium: 2.4 mg/dL (ref 1.7–2.4)

## 2019-12-01 MED ORDER — LORAZEPAM 1 MG PO TABS: 1.0000 mg | ORAL_TABLET | ORAL | Status: DC | PRN

## 2019-12-01 MED ORDER — THIAMINE HCL 100 MG PO TABS
100.0000 mg | ORAL_TABLET | Freq: Every day | ORAL | Status: DC
  Administered 2019-12-01 – 2019-12-02 (×2): 100 mg via ORAL
  Filled 2019-12-01 (×2): qty 1

## 2019-12-01 MED ORDER — HEPARIN BOLUS VIA INFUSION
5000.0000 [IU] | Freq: Once | INTRAVENOUS | Status: AC
  Administered 2019-12-01: 5000 [IU] via INTRAVENOUS

## 2019-12-01 MED ORDER — ACETAMINOPHEN 325 MG PO TABS: 650.0000 mg | ORAL_TABLET | ORAL | Status: DC | PRN

## 2019-12-01 MED ORDER — ASPIRIN EC 81 MG PO TBEC
81.0000 mg | DELAYED_RELEASE_TABLET | Freq: Every day | ORAL | Status: DC
  Administered 2019-12-01: 81 mg via ORAL
  Filled 2019-12-01: qty 1

## 2019-12-01 MED ORDER — THIAMINE HCL 100 MG/ML IJ SOLN: 100.0000 mg | Freq: Every day | INTRAMUSCULAR | Status: DC

## 2019-12-01 MED ORDER — IOHEXOL 350 MG/ML SOLN
75.0000 mL | Freq: Once | INTRAVENOUS | Status: AC | PRN
  Administered 2019-12-01: 75 mL via INTRAVENOUS

## 2019-12-01 MED ORDER — FOLIC ACID 1 MG PO TABS
1.0000 mg | ORAL_TABLET | Freq: Every day | ORAL | Status: DC
  Administered 2019-12-01 – 2019-12-02 (×2): 1 mg via ORAL
  Filled 2019-12-01 (×2): qty 1

## 2019-12-01 MED ORDER — DILTIAZEM HCL-DEXTROSE 125-5 MG/125ML-% IV SOLN (PREMIX)
5.0000 mg/h | INTRAVENOUS | Status: DC
  Administered 2019-12-01: 5 mg/h via INTRAVENOUS
  Filled 2019-12-01: qty 125

## 2019-12-01 MED ORDER — ADULT MULTIVITAMIN W/MINERALS CH
1.0000 | ORAL_TABLET | Freq: Every day | ORAL | Status: DC
  Administered 2019-12-01 – 2019-12-02 (×2): 1 via ORAL
  Filled 2019-12-01 (×2): qty 1

## 2019-12-01 MED ORDER — HEPARIN (PORCINE) 25000 UT/250ML-% IV SOLN
1200.0000 [IU]/h | INTRAVENOUS | Status: DC
  Administered 2019-12-01: 1200 [IU]/h via INTRAVENOUS
  Filled 2019-12-01: qty 250

## 2019-12-01 MED ORDER — LORAZEPAM 2 MG/ML IJ SOLN: 1.0000 mg | INTRAMUSCULAR | Status: DC | PRN

## 2019-12-01 MED ORDER — ASPIRIN EC 81 MG PO TBEC
81.0000 mg | DELAYED_RELEASE_TABLET | Freq: Every day | ORAL | Status: DC
  Administered 2019-12-02: 81 mg via ORAL
  Filled 2019-12-01: qty 1

## 2019-12-01 MED ORDER — ONDANSETRON HCL 4 MG/2ML IJ SOLN: 4.0000 mg | Freq: Four times a day (QID) | INTRAMUSCULAR | Status: DC | PRN

## 2019-12-01 MED ORDER — DILTIAZEM HCL 30 MG PO TABS
30.0000 mg | ORAL_TABLET | Freq: Four times a day (QID) | ORAL | Status: DC
  Administered 2019-12-01 – 2019-12-02 (×2): 30 mg via ORAL
  Filled 2019-12-01 (×2): qty 1

## 2019-12-01 NOTE — ED Notes (Signed)
Pt gave nurse a napkin he had coughed into. The napkin had about 10cc bright red blood. MD made aware via chat.

## 2019-12-01 NOTE — Progress Notes (Signed)
ANTICOAGULATION CONSULT NOTE - Initial Consult  Pharmacy Consult for Heparin Indication: new onset atrial fibrillation with RVR  No Known Allergies  Patient Measurements: Height: 5\' 10"  (177.8 cm) Weight: 88.5 kg (195 lb) IBW/kg (Calculated) : 73 Heparin Dosing Weight: 88.5 kg  Vital Signs: Temp: 98.2 F (36.8 C) (10/28 1531) Temp Source: Oral (10/28 1531) BP: 107/71 (10/28 1900) Pulse Rate: 32 (10/28 1900)  Labs: Recent Labs    12/01/19 1531 12/01/19 1735  HGB 14.0  --   HCT 41.7  --   PLT 192  --   CREATININE 1.11  --   CKTOTAL 167  --   TROPONINIHS 3 13    Estimated Creatinine Clearance: 71.4 mL/min (by C-G formula based on SCr of 1.11 mg/dL).   Medical History: History reviewed. No pertinent past medical history.   Assessment: 69 y.o male  brought to [facility] ED 10/28 after syncopal episode.  Coming down a ladder at home when he passed out,  family reports he fell backwards tin the grass and became unresponsive.  Responded to narcan.  Admits to using cocaine, marijuana and drinking wine earlier in the day.  Found to be in Atrial fibrillation with RVR, new onset.  Started on IV diltiazem infusion.   Pharmacy consulted to start IV heparin for Afib.   In ED coughed up some sputum and had blood in it.  Pt reports increased cough lately,  not SOB or hypoxic.  Some blood when blew his nose earlier today 10/28.  CT angio chest: negative for PE or other pathology.  CT head :  No evidence of acute intracranial abnormality.  Not had any ongoing bleeding, thus safe for anticoagulation for new Afib.    Goal of Therapy:  Heparin level 0.3-0.7 units/ml Monitor platelets by anticoagulation protocol: Yes   Plan:  Heparin bolus 5000 units IV x1 Heparin drip at 1200 units/hr Check heparin level in 6 hours  Daily HL and CBC   11/28, RPh Clinical Pharmacist Please check AMION for all [facility] Pharmacy phone numbers After 10:00 PM, call Main Pharmacy [phone] 12/01/2019,7:32  PM

## 2019-12-01 NOTE — H&P (Signed)
TRH H&P   Patient Demographics:    Maurice Wells, is a 69 y.o. male  MRN: [id number]   DOB - 02-06-1950  Admit Date - 12/01/2019  Outpatient Primary MD for the patient is Pearson Grippe, MD  Referring MD/NP/PA: Dr Blinda Leatherwood    Patient coming from: Home  Chief Complaint  Patient presents with  . Loss of Consciousness      HPI:    Maurice Wells  is a 69 y.o. male, with past medical history of tobacco abuse, alcohol abuse, polysubstance abuse, he presents to ED secondary to episode of syncope and loss of consciousness, patient reported he was doing some work at his house, and he climbed down from the ladder, he walked couple steps, but he fell backwards, collapsed to the ground, and where he lost consciousness, daughter at bedside, reports no evidence of seizures, patient was unresponsive per EMS upon their arrival, he was administrated Narcan at some point where he woke up, [place] any chest pain, palpitation, shortness of breath, abdominal pain, no nausea, no vomiting, he denies any injuries from his fall. - in ED he was noted to be in A. fib with RVR heart rate 138, appears to be new onset, alcohol level was elevated, he endorsed drinking beer and wine on a daily basis, as well he smokes tobacco on a daily basis, urine drug screen letter for [facility] and cocaine, he endorses smoking crack, most recent this morning, and was noted on Cardizem drip, CT head/cervical spine with no acute finding, CTA chest with no evidence of PE, [facility] hospitalist consulted to admit.    Review of systems:    In addition to the HPI above,  No Fever-chills, presents with syncope No Headache, No changes with Vision or hearing, No problems swallowing food or Liquids, No Chest pain, Cough or Shortness of Breath, No Abdominal pain, No Nausea or Vommitting, Bowel movements are regular, No Blood in stool or Urine, No  dysuria, No new skin rashes or bruises, No new joints pains-aches,  No new weakness, tingling, numbness in any extremity, No recent weight gain or loss, No polyuria, polydypsia or polyphagia, No significant Mental Stressors.  A full 10 point Review of Systems was done, except as stated above, all other Review of Systems were negative.   With Past History of the following :    History reviewed. No pertinent past medical history.    History reviewed. No pertinent surgical history.    Social History:     Social History   Tobacco Use  . Smoking status: Current Every Day Smoker    Packs/day: 0.50    Types: Cigarettes  . Smokeless tobacco: Never Used  Substance Use Topics  . Alcohol use: Yes    Alcohol/week: 5.0 standard drinks    Types: 3 Cans of beer, 2 Shots of liquor per week       Family History :  Family history reviewed, nonpertinent   Home Medications:   Not taking any home meds   Allergies:    No Known Allergies   Physical Exam:   Vitals  Blood pressure 119/70, pulse 62, temperature 98.2 F (36.8 C), temperature source Oral, resp. rate 11, height 5\' 10"  (1.778 m), weight 88.5 kg, SpO2 96 %.   1. General thin appearing male  lying in bed in NAD,   2. Normal affect and insight, Not Suicidal or Homicidal, Awake Alert, Oriented X 3.  3. No F.N deficits, ALL C.Nerves Intact, Strength 5/5 all 4 extremities, Sensation intact all 4 extremities, Plantars down going.  4. Ears and Eyes appear Normal, Conjunctivae clear, PERRLA. Moist Oral Mucosa.  5. Supple Neck, No JVD, No cervical lymphadenopathy appriciated, No Carotid Bruits.  6. Symmetrical Chest wall movement, Good air movement bilaterally, CTAB.  7. irr irr, No Gallops, Rubs or Murmurs, No Parasternal Heave.  8. Positive Bowel Sounds, Abdomen Soft, No tenderness, No organomegaly appriciated,No rebound -guarding or rigidity.  9.  No Cyanosis, Normal Skin Turgor, No Skin Rash or Bruise.  10.  Good muscle tone,  joints appear normal , no effusions, Normal ROM.  11. No Palpable Lymph Nodes in Neck or Axillae   Data Review:    CBC Recent Labs  Lab 12/01/19 1531  WBC 6.4  HGB 14.0  HCT 41.7  PLT 192  MCV 100.7*  MCH 33.8  MCHC 33.6  RDW 12.8  LYMPHSABS 1.8  MONOABS 0.5  EOSABS 0.1  BASOSABS 0.0   ------------------------------------------------------------------------------------------------------------------  Chemistries  Recent Labs  Lab 12/01/19 1531  NA 138  K 4.0  CL 103  CO2 27  GLUCOSE 114*  BUN 12  CREATININE 1.11  CALCIUM 8.9  AST 24  ALT 17  ALKPHOS 63  BILITOT 0.7   ------------------------------------------------------------------------------------------------------------------ estimated creatinine clearance is 71.4 mL/min (by C-G formula based on SCr of 1.11 mg/dL). ------------------------------------------------------------------------------------------------------------------ No results for input(s): TSH, T4TOTAL, T3FREE, THYROIDAB in the last 72 hours.  Invalid input(s): FREET3  Coagulation profile No results for input(s): INR, PROTIME in the last 168 hours. ------------------------------------------------------------------------------------------------------------------- No results for input(s): DDIMER in the last 72 hours. -------------------------------------------------------------------------------------------------------------------  Cardiac Enzymes No results for input(s): CKMB, TROPONINI, MYOGLOBIN in the last 168 hours.  Invalid input(s): CK ------------------------------------------------------------------------------------------------------------------ No results found for: BNP   ---------------------------------------------------------------------------------------------------------------  Urinalysis    Component Value Date/Time   COLORURINE YELLOW 12/01/2019 1700   APPEARANCEUR CLEAR 12/01/2019 1700   LABSPEC  1.011 12/01/2019 1700   PHURINE 7.0 12/01/2019 1700   GLUCOSEU NEGATIVE 12/01/2019 1700   HGBUR NEGATIVE 12/01/2019 1700   BILIRUBINUR NEGATIVE 12/01/2019 1700   KETONESUR NEGATIVE 12/01/2019 1700   PROTEINUR NEGATIVE 12/01/2019 1700   NITRITE NEGATIVE 12/01/2019 1700   LEUKOCYTESUR NEGATIVE 12/01/2019 1700    ----------------------------------------------------------------------------------------------------------------   Imaging Results:    CT HEAD WO CONTRAST  Result Date: 12/01/2019 CLINICAL DATA:  Patient found unresponsive in front yard. Fell off ladder. EXAM: CT HEAD WITHOUT CONTRAST CT CERVICAL SPINE WITHOUT CONTRAST TECHNIQUE: Multidetector CT imaging of the head and cervical spine was performed following the standard protocol without intravenous contrast. Multiplanar CT image reconstructions of the cervical spine were also generated. COMPARISON:  None. FINDINGS: CT HEAD FINDINGS Brain: No evidence of acute infarction, hemorrhage, hydrocephalus, extra-axial collection or mass lesion/mass effect. Mild periventricular white matter hypodensity, likely the sequela of chronic microvascular ischemic disease. Vascular: Calcific atherosclerosis. Skull: No acute fracture. Sinuses/Orbits: Minimal mucosal thickening of the left sphenoid sinus and scattered ethmoid air  cells. Otherwise, clear sinuses. Unremarkable orbits. Other:  No mastoid effusions. CT CERVICAL SPINE FINDINGS Alignment: Normal. Skull base and vertebrae: No acute fracture. No primary bone lesion or focal pathologic process. Soft tissues and spinal canal: No prevertebral fluid or swelling. No visible canal hematoma. Disc levels:  Mild multilevel degenerative disc disease. Upper chest: Emphysema without acute abnormality. Other: None. IMPRESSION: 1. No evidence of acute intracranial abnormality. 2. No evidence of acute cervical spine fracture or traumatic malalignment. Electronically Signed   By: Feliberto HartsFrederick S Jones MD   On: 12/01/2019  16:25   CT ANGIO CHEST PE W OR WO CONTRAST  Result Date: 12/01/2019 CLINICAL DATA:  69 year old male with concern for pulmonary embolism. EXAM: CT ANGIOGRAPHY CHEST WITH CONTRAST TECHNIQUE: Multidetector CT imaging of the chest was performed using the standard protocol during bolus administration of intravenous contrast. Multiplanar CT image reconstructions and MIPs were obtained to evaluate the vascular anatomy. CONTRAST:  75mL OMNIPAQUE IOHEXOL 350 MG/ML SOLN COMPARISON:  None. FINDINGS: Cardiovascular: There is no cardiomegaly or pericardial effusion. The thoracic aorta is unremarkable. No CT evidence of acute pulmonary embolism. Apparent faint density in the left lower lobe segmental pulmonary artery branch (178-187/5) most likely artifactual and related to mixing of the contrast. Residual old PE/scarring is not excluded. Mediastinum/Nodes: There is no hilar or mediastinal adenopathy. The esophagus and thyroid gland are grossly unremarkable. No mediastinal fluid collection. Lungs/Pleura: Background of centrilobular emphysema. There are bibasilar atelectatic changes. No focal consolidation, pleural effusion, pneumothorax. The central airways are patent. Upper Abdomen: A 3 cm partially visualized right renal upper pole cyst. Musculoskeletal: Degenerative changes of the spine. No acute osseous pathology. Loss of subcutaneous fat and cachexia. Review of the MIP images confirms the above findings. IMPRESSION: 1. No acute intrathoracic pathology. No CT evidence of acute pulmonary embolism. 2. Emphysema (ICD10-J43.9). Electronically Signed   By: Elgie CollardArash  Radparvar M.D.   On: 12/01/2019 18:51   CT CERVICAL SPINE WO CONTRAST  Result Date: 12/01/2019 CLINICAL DATA:  Patient found unresponsive in front yard. Fell off ladder. EXAM: CT HEAD WITHOUT CONTRAST CT CERVICAL SPINE WITHOUT CONTRAST TECHNIQUE: Multidetector CT imaging of the head and cervical spine was performed following the standard protocol without  intravenous contrast. Multiplanar CT image reconstructions of the cervical spine were also generated. COMPARISON:  None. FINDINGS: CT HEAD FINDINGS Brain: No evidence of acute infarction, hemorrhage, hydrocephalus, extra-axial collection or mass lesion/mass effect. Mild periventricular white matter hypodensity, likely the sequela of chronic microvascular ischemic disease. Vascular: Calcific atherosclerosis. Skull: No acute fracture. Sinuses/Orbits: Minimal mucosal thickening of the left sphenoid sinus and scattered ethmoid air cells. Otherwise, clear sinuses. Unremarkable orbits. Other:  No mastoid effusions. CT CERVICAL SPINE FINDINGS Alignment: Normal. Skull base and vertebrae: No acute fracture. No primary bone lesion or focal pathologic process. Soft tissues and spinal canal: No prevertebral fluid or swelling. No visible canal hematoma. Disc levels:  Mild multilevel degenerative disc disease. Upper chest: Emphysema without acute abnormality. Other: None. IMPRESSION: 1. No evidence of acute intracranial abnormality. 2. No evidence of acute cervical spine fracture or traumatic malalignment. Electronically Signed   By: Feliberto HartsFrederick S Jones MD   On: 12/01/2019 16:25   DG Chest Port 1 View  Result Date: 12/01/2019 CLINICAL DATA:  Syncope, arrhythmia palpitations EXAM: PORTABLE CHEST 1 VIEW COMPARISON:  CT 12/01/2018 FINDINGS: Some streaky opacities in the medial right lung base and left lower lobe compatible with atelectatic changes seen on comparison CT from the same day. Relative apical lucency and mild hyperinflation are consistent  with emphysematous features on comparison as well. No consolidation, features of edema, pneumothorax, or effusion. The cardiomediastinal contours are unremarkable. No acute osseous or soft tissue abnormality. IMPRESSION: 1. Bibasilar atelectasis, similar to comparison CT. 2.  Emphysema (ICD10-J43.9). Electronically Signed   By: Kreg Shropshire M.D.   On: 12/01/2019 19:32    My  personal review of EKG: Rhythm A fib with RVR, Rate  138 /min, QTc 439 , no Acute ST changes   Assessment & Plan:    Active Problems:   Syncope and collapse   Polysubstance abuse (HCC)   Atrial fibrillation with RVR (HCC)   A. fib with RVR -This is most likely provoked in the setting of cocaine use, heart rate uncontrolled, he is currently on Cardizem drip, he will be started on p.o. Cardizem hoping he can be transitioned off drip overnight. -This is related to cocaine. -We will monitor electrolyte, keep potassium>4, keep magnesium>2  -We will check TSH. -Check 2D echo. -CHA2DS2-VASc score is 1, will proceed with aspirin, in his case risks outweigh the benefits for full anticoagulation given history of falls, syncope and heavy alcohol use, polysubstance abuse where he is high risk for fall, keep on aspirin, unless his work-up showing any evidence of CHF where his CHA2DS2-VASc to score will be 2 or more then he will need to be on full anticoagulation. -Avoid beta-blockers for heart rate control history of cocaine use.. -CTA chest with no evidence of PE  Syncope -This is most likely related to cocaine abuse, A. fib with RVR and alcohol abuse and polysubstance abuse. -We will monitor on telemetry for any heart block or bradycardia. -We will obtain 2D echo.  tobacco abuse -He was counseled  plysubstance abuse including cocaine/THC -Counseled  Alcohol abuse -Counseled, he will be started on CIWA protocol    DVT Prophylaxis [place]  Heparin   AM Labs Ordered, also please review Full Orders  Family Communication: Admission, patients condition and plan of care including tests being ordered have been discussed with the patient and DAUGHTER who indicate understanding and agree with the plan and Code Status.  Code Status Full  Likely DC to  home  Condition GUARDED    Consults called: none    Admission status: Observation    Time spent in minutes : 60 minutes   Huey Bienenstock  M.D on 12/01/2019 at 8:29 PM   [facility] - Office  [phone]

## 2019-12-01 NOTE — ED Triage Notes (Addendum)
EMS reports pt unresponsive in yard and became responsive after narcan administered.  Pt says he had drank some wine and was doing some work in the yard.  Pt presently alert and oriented.   EMS says pt denies any drug use.  When asked about drugs in ED, pt says he smoked marijuana and crack today but says was earlier today.

## 2019-12-01 NOTE — Clinical Social Work Note (Signed)
TOC consulted for substance abuse resources. CSW visited pt in ED to present resources if pt was agreeable. Pt accepted resources. CSW left local SA resources at bedside with pt. TOC to follow.

## 2019-12-01 NOTE — ED Notes (Signed)
Crackers and ginger ale offered

## 2019-12-01 NOTE — ED Provider Notes (Addendum)
[facility] EMERGENCY DEPARTMENT Provider Note   CSN: [id number] Arrival date & time: 12/01/19  1512     History Chief Complaint  Patient presents with  . Loss of Consciousness    Maurice Wells is a 69 y.o. male.  Patient presents to the emergency department for evaluation of syncopal episode.  Patient reports that he was doing some work on his house when the episode occurred.  He has been on the roof fixing something, came down off of the ladder and then bystanders report that he fell backwards, collapsing to the ground.  He does not remember this or what happened after.  He woke up on the ground.  EMS report that he was unresponsive upon their arrival, administered Narcan and at some point he woke up.  Patient denies any chest pain, heart palpitations, shortness of breath, abdominal pain.  He denies any injury from the fall.  He reports that he feels fine.        History reviewed. No pertinent past medical history.  There are no problems to display for this patient.   History reviewed. No pertinent surgical history.     No family history on file.  Social History   Tobacco Use  . Smoking status: Current Every Day Smoker    Packs/day: 0.50    Types: Cigarettes  . Smokeless tobacco: Never Used  Substance Use Topics  . Alcohol use: Yes    Alcohol/week: 5.0 standard drinks    Types: 3 Cans of beer, 2 Shots of liquor per week  . Drug use: No    Home Medications Prior to Admission medications   Not on File    Allergies    Patient has no known allergies.  Review of Systems   Review of Systems  Neurological: Positive for syncope.  All other systems reviewed and are negative.   Physical Exam Updated Vital Signs BP 107/71   Pulse (!) 32   Temp 98.2 F (36.8 C) (Oral)   Resp 18   Ht  (1.778 m)   Wt 88.5 kg   SpO2 96%   BMI 27.98 kg/m   Physical Exam Vitals and nursing note reviewed.  Constitutional:      General: He is not in acute  distress.    Appearance: Normal appearance. He is well-developed.  HENT:     Head: Normocephalic and atraumatic.     Right Ear: Hearing normal.     Left Ear: Hearing normal.     Nose: Nose normal.  Eyes:     Conjunctiva/sclera: Conjunctivae normal.     Pupils: Pupils are equal, round, and reactive to light.  Cardiovascular:     Rate and Rhythm: Tachycardia present. Rhythm irregular.     Heart sounds: S1 normal and S2 normal. No murmur heard.  No friction rub. No gallop.   Pulmonary:     Effort: Pulmonary effort is normal. No respiratory distress.     Breath sounds: Normal breath sounds.  Chest:     Chest wall: No tenderness.  Abdominal:     General: Bowel sounds are normal.     Palpations: Abdomen is soft.     Tenderness: There is no abdominal tenderness. There is no guarding or rebound. Negative signs include Murphy's sign and McBurney's sign.     Hernia: No hernia is present.  Musculoskeletal:        General: Normal range of motion.     Cervical back: Normal range of motion and neck supple.  Skin:    General: Skin is warm and dry.     Findings: No rash.  Neurological:     Mental Status: He is alert and oriented to person, place, and time.     GCS: GCS eye subscore is 4. GCS verbal subscore is 5. GCS motor subscore is 6.     Cranial Nerves: No cranial nerve deficit.     Sensory: No sensory deficit.     Coordination: Coordination normal.  Psychiatric:        Speech: Speech normal.        Behavior: Behavior normal.        Thought Content: Thought content normal.     ED Results / Procedures / Treatments   Labs (all labs ordered are listed, but only abnormal results are displayed) Labs Reviewed  CBC WITH DIFFERENTIAL/PLATELET - Abnormal; Notable for the following components:      Result Value   RBC 4.14 (*)    MCV 100.7 (*)    All other components within normal limits  COMPREHENSIVE METABOLIC PANEL - Abnormal; Notable for the following components:   Glucose, Bld 114  (*)    All other components within normal limits  ETHANOL - Abnormal; Notable for the following components:   Alcohol, Ethyl (B) 63 (*)    All other components within normal limits  RAPID URINE DRUG SCREEN, HOSP PERFORMED - Abnormal; Notable for the following components:   Cocaine POSITIVE (*)    Tetrahydrocannabinol POSITIVE (*)    All other components within normal limits  RESPIRATORY PANEL BY RT PCR (FLU A&B, COVID)  URINALYSIS, ROUTINE W REFLEX MICROSCOPIC  AMMONIA  LACTIC ACID, PLASMA  CK  TROPONIN I (HIGH SENSITIVITY)  TROPONIN I (HIGH SENSITIVITY)    EKG EKG Interpretation  Date/Time:  Thursday December 01 2019 15:25:01 EDT Ventricular Rate:  138 PR Interval:    QRS Duration: 87 QT Interval:  334 QTC Calculation: 492 R Axis:   40 Text Interpretation: Atrial fibrillation Borderline prolonged QT interval Confirmed by Gilda Crease [phone]) on 12/01/2019 6:57:39 PM   Radiology CT HEAD WO CONTRAST  Result Date: 12/01/2019 CLINICAL DATA:  Patient found unresponsive in front yard. Fell off ladder. EXAM: CT HEAD WITHOUT CONTRAST CT CERVICAL SPINE WITHOUT CONTRAST TECHNIQUE: Multidetector CT imaging of the head and cervical spine was performed following the standard protocol without intravenous contrast. Multiplanar CT image reconstructions of the cervical spine were also generated. COMPARISON:  None. FINDINGS: CT HEAD FINDINGS Brain: No evidence of acute infarction, hemorrhage, hydrocephalus, extra-axial collection or mass lesion/mass effect. Mild periventricular white matter hypodensity, likely the sequela of chronic microvascular ischemic disease. Vascular: Calcific atherosclerosis. Skull: No acute fracture. Sinuses/Orbits: Minimal mucosal thickening of the left sphenoid sinus and scattered ethmoid air cells. Otherwise, clear sinuses. Unremarkable orbits. Other:  No mastoid effusions. CT CERVICAL SPINE FINDINGS Alignment: Normal. Skull base and vertebrae: No acute  fracture. No primary bone lesion or focal pathologic process. Soft tissues and spinal canal: No prevertebral fluid or swelling. No visible canal hematoma. Disc levels:  Mild multilevel degenerative disc disease. Upper chest: Emphysema without acute abnormality. Other: None. IMPRESSION: 1. No evidence of acute intracranial abnormality. 2. No evidence of acute cervical spine fracture or traumatic malalignment. Electronically Signed   By: Feliberto Harts MD   On: 12/01/2019 16:25   CT ANGIO CHEST PE W OR WO CONTRAST  Result Date: 12/01/2019 CLINICAL DATA:  69 year old male with concern for pulmonary embolism. EXAM: CT ANGIOGRAPHY CHEST WITH CONTRAST TECHNIQUE: Multidetector CT  imaging of the chest was performed using the standard protocol during bolus administration of intravenous contrast. Multiplanar CT image reconstructions and MIPs were obtained to evaluate the vascular anatomy. CONTRAST:  75mL OMNIPAQUE IOHEXOL 350 MG/ML SOLN COMPARISON:  None. FINDINGS: Cardiovascular: There is no cardiomegaly or pericardial effusion. The thoracic aorta is unremarkable. No CT evidence of acute pulmonary embolism. Apparent faint density in the left lower lobe segmental pulmonary artery branch (178-187/5) most likely artifactual and related to mixing of the contrast. Residual old PE/scarring is not excluded. Mediastinum/Nodes: There is no hilar or mediastinal adenopathy. The esophagus and thyroid gland are grossly unremarkable. No mediastinal fluid collection. Lungs/Pleura: Background of centrilobular emphysema. There are bibasilar atelectatic changes. No focal consolidation, pleural effusion, pneumothorax. The central airways are patent. Upper Abdomen: A 3 cm partially visualized right renal upper pole cyst. Musculoskeletal: Degenerative changes of the spine. No acute osseous pathology. Loss of subcutaneous fat and cachexia. Review of the MIP images confirms the above findings. IMPRESSION: 1. No acute intrathoracic  pathology. No CT evidence of acute pulmonary embolism. 2. Emphysema (ICD10-J43.9). Electronically Signed   By: Elgie CollardArash  Radparvar M.D.   On: 12/01/2019 18:51   CT CERVICAL SPINE WO CONTRAST  Result Date: 12/01/2019 CLINICAL DATA:  Patient found unresponsive in front yard. Fell off ladder. EXAM: CT HEAD WITHOUT CONTRAST CT CERVICAL SPINE WITHOUT CONTRAST TECHNIQUE: Multidetector CT imaging of the head and cervical spine was performed following the standard protocol without intravenous contrast. Multiplanar CT image reconstructions of the cervical spine were also generated. COMPARISON:  None. FINDINGS: CT HEAD FINDINGS Brain: No evidence of acute infarction, hemorrhage, hydrocephalus, extra-axial collection or mass lesion/mass effect. Mild periventricular white matter hypodensity, likely the sequela of chronic microvascular ischemic disease. Vascular: Calcific atherosclerosis. Skull: No acute fracture. Sinuses/Orbits: Minimal mucosal thickening of the left sphenoid sinus and scattered ethmoid air cells. Otherwise, clear sinuses. Unremarkable orbits. Other:  No mastoid effusions. CT CERVICAL SPINE FINDINGS Alignment: Normal. Skull base and vertebrae: No acute fracture. No primary bone lesion or focal pathologic process. Soft tissues and spinal canal: No prevertebral fluid or swelling. No visible canal hematoma. Disc levels:  Mild multilevel degenerative disc disease. Upper chest: Emphysema without acute abnormality. Other: None. IMPRESSION: 1. No evidence of acute intracranial abnormality. 2. No evidence of acute cervical spine fracture or traumatic malalignment. Electronically Signed   By: Feliberto HartsFrederick S Jones MD   On: 12/01/2019 16:25    Procedures Procedures (including critical care time)  Medications Ordered in ED Medications  diltiazem (CARDIZEM) 125 mg in dextrose 5% 125 mL (1 mg/mL) infusion (10 mg/hr Intravenous Rate/Dose Change 12/01/19 1903)  iohexol (OMNIPAQUE) 350 MG/ML injection 75 mL (75 mLs  Intravenous Contrast Given 12/01/19 1827)    ED Course  I have reviewed the triage vital signs and the nursing notes.  Pertinent labs & imaging results that were available during my care of the patient were reviewed by me and considered in my medical decision making (see chart for details).    MDM Rules/Calculators/A&P                          Patient brought to the emergency department from home after syncopal episode.  Patient reports that he was working on his house when the episode occurred.  He had just come down off of a ladder when he passed out.  Family reports that he fell backwards into the grass and became unresponsive.  They did not notice any seizure-like activity but  could not wake him up.  EMS report that he responded to Narcan.  Patient denies opioid use.  Drug screen supports this.  He did admit to using cocaine, marijuana and drinking wine earlier today.  He did not want his family to know this, however.  At arrival he was noted to be tachycardic.  He was found to be in atrial fibrillation with a rapid ventricular response.  Patient denies any history of this.  Unclear if he has been in this for some time or if this is a response to his drug and alcohol use.  He was placed on Cardizem drip and rate is improving.  While here in the department, patient coughed up some sputum and there was blood in it.  He reports that he has had an increased cough lately.  He is not feeling short of breath and is not hypoxic.  Because of this, CT angiography of chest was performed.  No evidence of PE or other pathology noted.  Patient reports that he blew his nose earlier today and there was some blood in it as well.  He has not had any ongoing bleeding now and it is felt that he is safe for anticoagulation for his atrial fibrillation.  We will start heparin as this is easy to stop and reverse.  Patient  Reports no past medical history, but admits that he hasn't seen a doctor for many years, therefore  CHA2DS2/VAS calculation may not be reliable.    CHA2DS2/VAS calculation Current as of just now     1 >= 2 Points: High Risk  1 - 1.99 Points: Medium Risk  0 Points: Low Risk    No Change      Details    This score determines the patient's risk of having a stroke if the  patient has atrial fibrillation.       Points Metrics  0 Has Congestive Heart Failure:  No    Current as of just now  0 Has Vascular Disease:  No    Current as of just now  0 Has Hypertension:  No    Current as of just now  1 Age:  73    Current as of just now  0 Has Diabetes:  No    Current as of just now  0 Had Stroke:  No  Had TIA:  No  Had thromboembolism:  No    Current as of just now  0 Male:  No    Current as of just now           CRITICAL CARE Performed by: Gilda Crease   Total critical care time: 30 minutes  Critical care time was exclusive of separately billable procedures and treating other patients.  Critical care was necessary to treat or prevent imminent or life-threatening deterioration.  Critical care was time spent personally by me on the following activities: development of treatment plan with patient and/or surrogate as well as nursing, discussions with consultants, evaluation of patient's response to treatment, examination of patient, obtaining history from patient or surrogate, ordering and performing treatments and interventions, ordering and review of laboratory studies, ordering and review of radiographic studies, pulse oximetry and re-evaluation of patient's condition.  Final Clinical Impression(s) / ED Diagnoses Final diagnoses:  Syncope, unspecified syncope type  Atrial fibrillation with [facility])    Rx / DC Orders ED Discharge Orders    None       Blinda Leatherwood Canary Brim, MD 12/01/19 1904  Gilda Crease, MD 12/01/19 [phone]

## 2019-12-01 NOTE — ED Notes (Signed)
Family at bedside. 

## 2019-12-02 ENCOUNTER — Other Ambulatory Visit: Payer: Self-pay

## 2019-12-02 ENCOUNTER — Observation Stay (HOSPITAL_BASED_OUTPATIENT_CLINIC_OR_DEPARTMENT_OTHER): Payer: Medicare Other

## 2019-12-02 DIAGNOSIS — I340 Nonrheumatic mitral (valve) insufficiency: Secondary | ICD-10-CM | POA: Diagnosis not present

## 2019-12-02 DIAGNOSIS — I4891 Unspecified atrial fibrillation: Secondary | ICD-10-CM | POA: Diagnosis not present

## 2019-12-02 DIAGNOSIS — R55 Syncope and collapse: Secondary | ICD-10-CM | POA: Diagnosis not present

## 2019-12-02 DIAGNOSIS — F1910 Other psychoactive substance abuse, uncomplicated: Secondary | ICD-10-CM | POA: Diagnosis not present

## 2019-12-02 DIAGNOSIS — I361 Nonrheumatic tricuspid (valve) insufficiency: Secondary | ICD-10-CM

## 2019-12-02 LAB — ECHOCARDIOGRAM COMPLETE
AR max vel: 2.66 cm2
AV Area VTI: 3.51 cm2
AV Area mean vel: 2.55 cm2
AV Mean grad: 1.7 mmHg
AV Peak grad: 3.7 mmHg
Ao pk vel: 0.96 m/s
Area-P 1/2: 2.66 cm2
Height: 70 in
S' Lateral: 2.99 cm
Weight: 3120 oz

## 2019-12-02 LAB — BASIC METABOLIC PANEL
Anion gap: 8 (ref 5–15)
BUN: 15 mg/dL (ref 8–23)
CO2: 27 mmol/L (ref 22–32)
Calcium: 8.9 mg/dL (ref 8.9–10.3)
Chloride: 102 mmol/L (ref 98–111)
Creatinine, Ser: 0.88 mg/dL (ref 0.61–1.24)
GFR, Estimated: >60 mL/min (ref >60)
Glucose, Bld: 104 mg/dL — ABNORMAL HIGH (ref 70–99)
Potassium: 4.1 mmol/L (ref 3.5–5.1)
Sodium: 137 mmol/L (ref 135–145)

## 2019-12-02 LAB — LIPID PANEL
Cholesterol: 174 mg/dL (ref 0–200)
HDL: 58 mg/dL (ref >40)
LDL Cholesterol: 88 mg/dL (ref 0–99)
Total CHOL/HDL Ratio: 3 RATIO
Triglycerides: 138 mg/dL (ref <150)
VLDL: 28 mg/dL (ref 0–40)

## 2019-12-02 LAB — CBC
HCT: 41.9 % (ref 39.0–52.0)
Hemoglobin: 13.9 g/dL (ref 13.0–17.0)
MCH: 33.8 pg (ref 26.0–34.0)
MCHC: 33.2 g/dL (ref 30.0–36.0)
MCV: 101.9 fL — ABNORMAL HIGH (ref 80.0–100.0)
Platelets: 171 10*3/uL (ref 150–400)
RBC: 4.11 MIL/uL — ABNORMAL LOW (ref 4.22–5.81)
RDW: 12.9 % (ref 11.5–15.5)
WBC: 7.2 10*3/uL (ref 4.0–10.5)
nRBC: 0 % (ref 0.0–0.2)

## 2019-12-02 LAB — MAGNESIUM: Magnesium: 2.1 mg/dL (ref 1.7–2.4)

## 2019-12-02 LAB — HIV ANTIBODY (ROUTINE TESTING W REFLEX): HIV Screen 4th Generation wRfx: NONREACTIVE

## 2019-12-02 MED ORDER — SODIUM CHLORIDE 0.9 % IV BOLUS
500.0000 mL | Freq: Once | INTRAVENOUS | Status: AC
  Administered 2019-12-02: 500 mL via INTRAVENOUS

## 2019-12-02 MED ORDER — POTASSIUM CHLORIDE CRYS ER 20 MEQ PO TBCR
20.0000 meq | EXTENDED_RELEASE_TABLET | Freq: Once | ORAL | Status: AC
  Administered 2019-12-02: 20 meq via ORAL
  Filled 2019-12-02: qty 1

## 2019-12-02 MED ORDER — SODIUM CHLORIDE 0.9 % IV BOLUS
750.0000 mL | Freq: Once | INTRAVENOUS | Status: AC
  Administered 2019-12-02: 750 mL via INTRAVENOUS

## 2019-12-02 MED ORDER — DILTIAZEM HCL 30 MG PO TABS: 30.0000 mg | ORAL_TABLET | Freq: Two times a day (BID) | ORAL | 0 refills | Status: AC | PRN

## 2019-12-02 MED ORDER — FOLIC ACID 1 MG PO TABS: 1.0000 mg | ORAL_TABLET | Freq: Every day | ORAL | 2 refills | Status: AC

## 2019-12-02 MED ORDER — ADULT MULTIVITAMIN W/MINERALS CH: 1.0000 | ORAL_TABLET | Freq: Every day | ORAL | 2 refills | Status: AC

## 2019-12-02 MED ORDER — SODIUM CHLORIDE 0.9 % IV BOLUS
1000.0000 mL | Freq: Once | INTRAVENOUS | Status: AC
  Administered 2019-12-02: 1000 mL via INTRAVENOUS

## 2019-12-02 MED ORDER — THIAMINE HCL 100 MG PO TABS: 100.0000 mg | ORAL_TABLET | Freq: Every day | ORAL | 2 refills | Status: AC

## 2019-12-02 MED ORDER — ASPIRIN 81 MG PO TBEC: 81.0000 mg | DELAYED_RELEASE_TABLET | Freq: Every day | ORAL | 2 refills | Status: AC

## 2019-12-02 NOTE — Progress Notes (Signed)
[facility] night shift.  The patient became hypotensive with a systolic in the 70s.  Cardizem infusion has been held.  Will likely hold 6 AM oral Cardizem.  A 500 mL NS bolus has been ordered.  Kdur 20 mEq p.o. Osie Bond, MD.

## 2019-12-02 NOTE — Discharge Summary (Signed)
Physician Discharge Summary  Maurice Wells NGE:[id number] DOB: July 09, 1950 DOA: 12/01/2019  PCP: Pearson Grippe, MD [facility]  Admit date: 12/01/2019 Discharge date: 12/02/2019  Admitted From:  Home  Disposition: Home   Recommendations for Outpatient Follow-up:  1. Follow up with PCP in 5-7 days 2. Follow up with cardiology  Discharge Condition: STABLE   CODE STATUS: FULL    Brief Hospitalization Summary: Please see all hospital notes, images, labs for full details of the hospitalization. ADMISSION HPI:  69 y.o. male, with past medical history of tobacco abuse, alcohol abuse, polysubstance abuse, he presents to ED secondary to episode of syncope and loss of consciousness, patient reported he was doing some work at his house, and he climbed down from the ladder, he walked couple steps, but he fell backwards, collapsed to the ground, and where he lost consciousness, daughter at bedside, reports no evidence of seizures, patient was unresponsive per EMS upon their arrival, he was administrated Narcan at some point where he woke up, [place] any chest pain, palpitation, shortness of breath, abdominal pain, no nausea, no vomiting, he denies any injuries from his fall. - in ED he was noted to be in A. fib with RVR heart rate 138, appears to be new onset, alcohol level was elevated, he endorsed drinking beer and wine on a daily basis, as well he smokes tobacco on a daily basis, urine drug screen letter for [facility] and cocaine, he endorses smoking crack, most recent this morning, and was noted on Cardizem drip, CT head/cervical spine with no acute finding, CTA chest with no evidence of PE, [facility] consulted to admit.  Hospital Course:  The patient was admitted with a bout of A. fib with RVR likely precipitated by cocaine use.  He was briefly placed on a Cardizem infusion and this was discontinued after he developed hypotension.  He has been in sinus rhythm since that time and has had no recurrence of  atrial fibrillation.  His electrolytes have been repleted.  He had a 2D echocardiogram done with no significant findings and results noted below.  His CHA2DS2-VASc score is 1 and he was started on aspirin for anticoagulation.  We have been avoiding beta-blockers due to his ongoing cocaine abuse.  His CTA chest was negative for pulmonary embolus.  He was counseled by the social services regarding drugs of abuse and polysubstance abuse and he was not interested in receiving further treatment for addiction.  His TSH was within normal limits and normal lipid panel.  He was counseled on smoking cessation.  He will he was counseled on stopping all substance abuse especially cocaine and alcohol.  He was given prescription for diltiazem 30 mg to take 1 tablet every 12 hours as needed if he develops recurrent palpitations.  He has been referred for ambulatory cardiology outpatient follow-up.  I recommended close follow-up with his primary care physician in the next 5 to 7 days.  The patient is adamant about discharging home he is not willing to stay an extra day for further monitoring and we will discharge him home with instructions and cautions and requesting early follow-up.  Discharge Diagnoses:  Active Problems:   Syncope and collapse   Polysubstance abuse [facility])   Atrial fibrillation with RVR (HCC)   New onset atrial fibrillation [facility])   Discharge Instructions: Discharge Instructions    Ambulatory referral to Cardiology   Complete by: As directed      Allergies as of 12/02/2019   No Known Allergies  Medication List    TAKE these medications   aspirin 81 MG EC tablet Take 1 tablet (81 mg total) by mouth daily. Swallow whole. Start taking on: December 03, 2019   diltiazem 30 MG tablet Commonly known as: CARDIZEM Take 1 tablet (30 mg total) by mouth every 12 (twelve) hours as needed (palpitation, rapid heart rate).   folic acid 1 MG tablet Commonly known as: FOLVITE Take 1 tablet (1 mg  total) by mouth daily. Start taking on: December 03, 2019   multivitamin with minerals Tabs tablet Take 1 tablet by mouth daily. Start taking on: December 03, 2019   thiamine 100 MG tablet Take 1 tablet (100 mg total) by mouth daily. Start taking on: December 03, 2019       Follow-up Information    Pearson Grippe, MD. Schedule an appointment as soon as possible for a visit in 5 day(s).   Specialty: Internal Medicine Why: Hospital Follow Up  Contact information: [address] [place] [address] [phone]        [facility] [place]. Schedule an appointment as soon as possible for a visit in 2 week(s).   Specialty: Cardiology Contact information: [address] [place] [address] [phone]             No Known Allergies Allergies as of 12/02/2019   No Known Allergies     Medication List    TAKE these medications   aspirin 81 MG EC tablet Take 1 tablet (81 mg total) by mouth daily. Swallow whole. Start taking on: December 03, 2019   diltiazem 30 MG tablet Commonly known as: CARDIZEM Take 1 tablet (30 mg total) by mouth every 12 (twelve) hours as needed (palpitation, rapid heart rate).   folic acid 1 MG tablet Commonly known as: FOLVITE Take 1 tablet (1 mg total) by mouth daily. Start taking on: December 03, 2019   multivitamin with minerals Tabs tablet Take 1 tablet by mouth daily. Start taking on: December 03, 2019   thiamine 100 MG tablet Take 1 tablet (100 mg total) by mouth daily. Start taking on: December 03, 2019       Procedures/Studies: CT HEAD WO CONTRAST  Result Date: 12/01/2019 CLINICAL DATA:  Patient found unresponsive in front yard. Fell off ladder. EXAM: CT HEAD WITHOUT CONTRAST CT CERVICAL SPINE WITHOUT CONTRAST TECHNIQUE: Multidetector CT imaging of the head and cervical spine was performed following the standard protocol without intravenous contrast. Multiplanar CT image reconstructions of the  cervical spine were also generated. COMPARISON:  None. FINDINGS: CT HEAD FINDINGS Brain: No evidence of acute infarction, hemorrhage, hydrocephalus, extra-axial collection or mass lesion/mass effect. Mild periventricular white matter hypodensity, likely the sequela of chronic microvascular ischemic disease. Vascular: Calcific atherosclerosis. Skull: No acute fracture. Sinuses/Orbits: Minimal mucosal thickening of the left sphenoid sinus and scattered ethmoid air cells. Otherwise, clear sinuses. Unremarkable orbits. Other:  No mastoid effusions. CT CERVICAL SPINE FINDINGS Alignment: Normal. Skull base and vertebrae: No acute fracture. No primary bone lesion or focal pathologic process. Soft tissues and spinal canal: No prevertebral fluid or swelling. No visible canal hematoma. Disc levels:  Mild multilevel degenerative disc disease. Upper chest: Emphysema without acute abnormality. Other: None. IMPRESSION: 1. No evidence of acute intracranial abnormality. 2. No evidence of acute cervical spine fracture or traumatic malalignment. Electronically Signed   By: Feliberto Harts MD   On: 12/01/2019 16:25   CT ANGIO CHEST PE W OR WO CONTRAST  Result Date: 12/01/2019 CLINICAL  DATA:  69 year old male with concern for pulmonary embolism. EXAM: CT ANGIOGRAPHY CHEST WITH CONTRAST TECHNIQUE: Multidetector CT imaging of the chest was performed using the standard protocol during bolus administration of intravenous contrast. Multiplanar CT image reconstructions and MIPs were obtained to evaluate the vascular anatomy. CONTRAST:  75mL OMNIPAQUE IOHEXOL 350 MG/ML SOLN COMPARISON:  None. FINDINGS: Cardiovascular: There is no cardiomegaly or pericardial effusion. The thoracic aorta is unremarkable. No CT evidence of acute pulmonary embolism. Apparent faint density in the left lower lobe segmental pulmonary artery branch (178-187/5) most likely artifactual and related to mixing of the contrast. Residual old PE/scarring is not  excluded. Mediastinum/Nodes: There is no hilar or mediastinal adenopathy. The esophagus and thyroid gland are grossly unremarkable. No mediastinal fluid collection. Lungs/Pleura: Background of centrilobular emphysema. There are bibasilar atelectatic changes. No focal consolidation, pleural effusion, pneumothorax. The central airways are patent. Upper Abdomen: A 3 cm partially visualized right renal upper pole cyst. Musculoskeletal: Degenerative changes of the spine. No acute osseous pathology. Loss of subcutaneous fat and cachexia. Review of the MIP images confirms the above findings. IMPRESSION: 1. No acute intrathoracic pathology. No CT evidence of acute pulmonary embolism. 2. Emphysema (ICD10-J43.9). Electronically Signed   By: Elgie Collard M.D.   On: 12/01/2019 18:51   CT CERVICAL SPINE WO CONTRAST  Result Date: 12/01/2019 CLINICAL DATA:  Patient found unresponsive in front yard. Fell off ladder. EXAM: CT HEAD WITHOUT CONTRAST CT CERVICAL SPINE WITHOUT CONTRAST TECHNIQUE: Multidetector CT imaging of the head and cervical spine was performed following the standard protocol without intravenous contrast. Multiplanar CT image reconstructions of the cervical spine were also generated. COMPARISON:  None. FINDINGS: CT HEAD FINDINGS Brain: No evidence of acute infarction, hemorrhage, hydrocephalus, extra-axial collection or mass lesion/mass effect. Mild periventricular white matter hypodensity, likely the sequela of chronic microvascular ischemic disease. Vascular: Calcific atherosclerosis. Skull: No acute fracture. Sinuses/Orbits: Minimal mucosal thickening of the left sphenoid sinus and scattered ethmoid air cells. Otherwise, clear sinuses. Unremarkable orbits. Other:  No mastoid effusions. CT CERVICAL SPINE FINDINGS Alignment: Normal. Skull base and vertebrae: No acute fracture. No primary bone lesion or focal pathologic process. Soft tissues and spinal canal: No prevertebral fluid or swelling. No visible  canal hematoma. Disc levels:  Mild multilevel degenerative disc disease. Upper chest: Emphysema without acute abnormality. Other: None. IMPRESSION: 1. No evidence of acute intracranial abnormality. 2. No evidence of acute cervical spine fracture or traumatic malalignment. Electronically Signed   By: Feliberto Harts MD   On: 12/01/2019 16:25   DG Chest Port 1 View  Result Date: 12/01/2019 CLINICAL DATA:  Syncope, arrhythmia palpitations EXAM: PORTABLE CHEST 1 VIEW COMPARISON:  CT 12/01/2018 FINDINGS: Some streaky opacities in the medial right lung base and left lower lobe compatible with atelectatic changes seen on comparison CT from the same day. Relative apical lucency and mild hyperinflation are consistent with emphysematous features on comparison as well. No consolidation, features of edema, pneumothorax, or effusion. The cardiomediastinal contours are unremarkable. No acute osseous or soft tissue abnormality. IMPRESSION: 1. Bibasilar atelectasis, similar to comparison CT. 2.  Emphysema (ICD10-J43.9). Electronically Signed   By: Kreg Shropshire M.D.   On: 12/01/2019 19:32   ECHOCARDIOGRAM COMPLETE  Result Date: 12/02/2019    ECHOCARDIOGRAM REPORT   Patient Name:   ACHILLES NEVILLE Date of Exam: 12/02/2019 Medical Rec #:  [id number]      Height:       70.0 in Accession #:    [id number]     Weight:  195.0 lb Date of Birth:  March 20, 1950      BSA:          2.065 m Patient Age:    69 years       BP:           91/62 mmHg Patient Gender: M              HR:           53 bpm. [facility] Procedure: 2D Echo, Cardiac Doppler and Color Doppler Indications:    I48.91* Unspeicified atrial fibrillation  History:        Patient has no prior history of Echocardiogram examinations.                 Arrythmias:Atrial Fibrillation; Signs/Symptoms:Syncope.  Sonographer:    Eulah PontSarah Pirrotta RDCS Referring Phys: [id number] Maylene RoesLANFORD L Shaquasia Caponigro IMPRESSIONS  1. Left ventricular ejection fraction, by estimation, is 60 to  65%. The left ventricle has normal function. The left ventricle has no regional wall motion abnormalities. Left ventricular diastolic parameters were normal.  2. Right ventricular systolic function is normal. The right ventricular size is normal. There is normal pulmonary artery systolic pressure.  3. The mitral valve is normal in structure. Mild to moderate mitral valve regurgitation. No evidence of mitral stenosis.  4. The aortic valve is tricuspid. Aortic valve regurgitation is not visualized. No aortic stenosis is present.  5. The inferior vena cava is normal in size with greater than 50% respiratory variability, suggesting right atrial pressure of 3 mmHg. FINDINGS  Left Ventricle: Left ventricular ejection fraction, by estimation, is 60 to 65%. The left ventricle has normal function. The left ventricle has no regional wall motion abnormalities. The left ventricular internal cavity size was normal in size. There is  no left ventricular hypertrophy. Left ventricular diastolic parameters were normal. Right Ventricle: The right ventricular size is normal. No increase in right ventricular wall thickness. Right ventricular systolic function is normal. There is normal pulmonary artery systolic pressure. The tricuspid regurgitant velocity is 2.69 m/s, and  with an assumed right atrial pressure of 3 mmHg, the estimated right ventricular systolic pressure is 31.9 mmHg. Left Atrium: Left atrial size was normal in size. Right Atrium: Right atrial size was normal in size. Pericardium: There is no evidence of pericardial effusion. Mitral Valve: The mitral valve is normal in structure. Mild to moderate mitral valve regurgitation. No evidence of mitral valve stenosis. Tricuspid Valve: The tricuspid valve is normal in structure. Tricuspid valve regurgitation is mild . No evidence of tricuspid stenosis. Aortic Valve: The aortic valve is tricuspid. Aortic valve regurgitation is not visualized. No aortic stenosis is present.  Aortic valve mean gradient measures 1.7 mmHg. Aortic valve peak gradient measures 3.7 mmHg. Aortic valve area, by VTI measures 3.51 cm. Pulmonic Valve: The pulmonic valve was not well visualized. Pulmonic valve regurgitation is not visualized. No evidence of pulmonic stenosis. Aorta: The aortic root is normal in size and structure. Pulmonary Artery: Borderline pulmonary HTN, PASP is 32 mmHg. Venous: The inferior vena cava is normal in size with greater than 50% respiratory variability, suggesting right atrial pressure of 3 mmHg. IAS/Shunts: The interatrial septum was not well visualized.  LEFT VENTRICLE PLAX 2D LVIDd:         4.94 cm  Diastology LVIDs:         2.99 cm  LV e' medial:    8.81 cm/s LV PW:         0.94  cm  LV E/e' medial:  8.9 LV IVS:        0.89 cm  LV e' lateral:   10.60 cm/s LVOT diam:     2.10 cm  LV E/e' lateral: 7.4 LV SV:         65 LV SV Index:   31 LVOT Area:     3.46 cm  RIGHT VENTRICLE RV S prime:     10.00 cm/s TAPSE (M-mode): 1.4 cm LEFT ATRIUM             Index       RIGHT ATRIUM           Index LA diam:        3.50 cm 1.69 cm/m  RA Area:     14.00 cm LA Vol (A2C):   46.0 ml 22.28 ml/m RA Volume:   33.80 ml  16.37 ml/m LA Vol (A4C):   39.7 ml 19.23 ml/m LA Biplane Vol: 43.3 ml 20.97 ml/m  AORTIC VALVE AV Area (Vmax):    2.66 cm AV Area (Vmean):   2.55 cm AV Area (VTI):     3.51 cm AV Vmax:           95.56 cm/s AV Vmean:          61.477 cm/s AV VTI:            0.184 m AV Peak Grad:      3.7 mmHg AV Mean Grad:      1.7 mmHg LVOT Vmax:         73.40 cm/s LVOT Vmean:        45.339 cm/s LVOT VTI:          0.187 m LVOT/AV VTI ratio: 1.01  AORTA Ao Root diam: 3.90 cm MITRAL VALVE               TRICUSPID VALVE MV Area (PHT): 2.66 cm    TR Peak grad:   28.9 mmHg MV Decel Time: 285 msec    TR Vmax:        269.00 cm/s MV E velocity: 78.80 cm/s MV A velocity: 76.70 cm/s  SHUNTS MV E/A ratio:  1.03        Systemic VTI:  0.19 m                            Systemic Diam: 2.10 cm Dina Rich MD Electronically signed by Dina Rich MD Signature Date/Time: 12/02/2019/1:28:00 PM    Final       Subjective: Pt says he feels much better, no recurrence of palpitation, he wants to go home, he is insistent that he go home.  Discharge Exam: Vitals:   12/02/19 1029 12/02/19 1201  BP: 95/74 110/62  Pulse: 71 (!) 53  Resp: 20 12  Temp:    SpO2: 98% 100%   Vitals:   12/02/19 0630 12/02/19 0830 12/02/19 1029 12/02/19 1201  BP: 91/62 102/70 95/74 110/62  Pulse: 67 63 71 (!) 53  Resp: 17  20 12   Temp:      TempSrc:      SpO2: 96% 99% 98% 100%  Weight:      Height:       General: Pt is alert, awake, not in acute distress Cardiovascular: normal S1/S2 +, no rubs, no gallops Respiratory: CTA bilaterally, no wheezing, no rhonchi Abdominal: Soft, NT, ND, bowel sounds + Extremities: no edema, no cyanosis   The results  of significant diagnostics from this hospitalization (including imaging, microbiology, ancillary and laboratory) are listed below for reference.     Microbiology: Recent Results (from the past 240 hour(s))  Respiratory Panel by RT PCR (Flu A&B, Covid) - Nasopharyngeal Swab     Status: None   Collection Time: 12/01/19  4:35 PM   Specimen: Nasopharyngeal Swab  Result Value Ref Range Status   SARS Coronavirus 2 by RT PCR NEGATIVE NEGATIVE Final    Comment: (NOTE) SARS-CoV-2 target nucleic acids are NOT DETECTED.  The SARS-CoV-2 RNA is generally detectable in upper respiratoy specimens during the acute phase of infection. The lowest concentration of SARS-CoV-2 viral copies this assay can detect is 131 copies/mL. A negative result does not preclude SARS-Cov-2 infection and should not be used as the sole basis for treatment or other patient management decisions. A negative result may occur with  improper specimen collection/handling, submission of specimen other than nasopharyngeal swab, presence of viral mutation(s) within the areas targeted by this  assay, and inadequate number of viral copies (<131 copies/mL). A negative result must be combined with clinical observations, patient history, and epidemiological information. The expected result is Negative.  Fact Sheet for Patients:  https://www.moore.com/  Fact Sheet for Healthcare Providers:  https://www.young.biz/  This test is no t yet approved or cleared by the [place] FDA and  has been authorized for detection and/or diagnosis of SARS-CoV-2 by FDA under an Emergency Use Authorization (EUA). This EUA will remain  in effect (meaning this test can be used) for the duration of the COVID-19 declaration under Section 564(b)(1) of the Act, 21 U.S.C. section 360bbb-3(b)(1), unless the authorization is terminated or revoked sooner.     Influenza A by PCR NEGATIVE NEGATIVE Final   Influenza B by PCR NEGATIVE NEGATIVE Final    Comment: (NOTE) The Xpert Xpress SARS-CoV-2/FLU/RSV assay is intended as an aid in  the diagnosis of influenza from Nasopharyngeal swab specimens and  should not be used as a sole basis for treatment. Nasal washings and  aspirates are unacceptable for Xpert Xpress SARS-CoV-2/FLU/RSV  testing.  Fact Sheet for Patients: https://www.moore.com/  Fact Sheet for Healthcare Providers: https://www.young.biz/  This test is not yet approved or cleared by the [place] FDA and  has been authorized for detection and/or diagnosis of SARS-CoV-2 by  FDA under an Emergency Use Authorization (EUA). This EUA will remain  in effect (meaning this test can be used) for the duration of the  Covid-19 declaration under Section 564(b)(1) of the Act, 21  U.S.C. section 360bbb-3(b)(1), unless the authorization is  terminated or revoked. Performed at [facility], [address] [place], [place] [address]      Labs: BNP (last 3 results) No results for input(s): BNP in the last 8760  hours. Basic Metabolic Panel: Recent Labs  Lab 12/01/19 1531 12/01/19 2055 12/02/19 0559  NA 138 140 137  K 4.0 3.6 4.1  CL 103 103 102  CO2 27 25 27   GLUCOSE 114* 128* 104*  BUN 12 10 15   CREATININE 1.11 0.86 0.88  CALCIUM 8.9 9.2 8.9  MG  --  2.4 2.1  PHOS  --  3.0  --    Liver Function Tests: Recent Labs  Lab 12/01/19 1531 12/01/19 2055  AST 24 23  ALT 17 17  ALKPHOS 63 61  BILITOT 0.7 0.6  PROT 6.6 6.7  ALBUMIN 4.0 3.9   No results for input(s): LIPASE, AMYLASE in the last 168 hours. Recent Labs  Lab 12/01/19  1531  AMMONIA 10   CBC: Recent Labs  Lab 12/01/19 1531 12/01/19 2055 12/02/19 0559  WBC 6.4 9.3 7.2  NEUTROABS 4.0  --   --   HGB 14.0 14.5 13.9  HCT 41.7 43.7 41.9  MCV 100.7* 100.7* 101.9*  PLT 192 205 171   Cardiac Enzymes: Recent Labs  Lab 12/01/19 1531  CKTOTAL 167   BNP: Invalid input(s): POCBNP CBG: No results for input(s): GLUCAP in the last 168 hours. D-Dimer No results for input(s): DDIMER in the last 72 hours. Hgb A1c No results for input(s): HGBA1C in the last 72 hours. Lipid Profile Recent Labs    12/02/19 0559  CHOL 174  HDL 58  LDLCALC 88  TRIG 138  CHOLHDL 3.0   Thyroid function studies Recent Labs    12/01/19 2224  TSH 3.283   Anemia work up No results for input(s): VITAMINB12, FOLATE, FERRITIN, TIBC, IRON, RETICCTPCT in the last 72 hours. Urinalysis    Component Value Date/Time   COLORURINE YELLOW 12/01/2019 1700   APPEARANCEUR CLEAR 12/01/2019 1700   LABSPEC 1.011 12/01/2019 1700   PHURINE 7.0 12/01/2019 1700   GLUCOSEU NEGATIVE 12/01/2019 1700   HGBUR NEGATIVE 12/01/2019 1700   BILIRUBINUR NEGATIVE 12/01/2019 1700   KETONESUR NEGATIVE 12/01/2019 1700   PROTEINUR NEGATIVE 12/01/2019 1700   NITRITE NEGATIVE 12/01/2019 1700   LEUKOCYTESUR NEGATIVE 12/01/2019 1700   Sepsis Labs Invalid input(s): PROCALCITONIN,  WBC,  LACTICIDVEN Microbiology Recent Results (from the past 240 hour(s))   Respiratory Panel by RT PCR (Flu A&B, Covid) - Nasopharyngeal Swab     Status: None   Collection Time: 12/01/19  4:35 PM   Specimen: Nasopharyngeal Swab  Result Value Ref Range Status   SARS Coronavirus 2 by RT PCR NEGATIVE NEGATIVE Final    Comment: (NOTE) SARS-CoV-2 target nucleic acids are NOT DETECTED.  The SARS-CoV-2 RNA is generally detectable in upper respiratoy specimens during the acute phase of infection. The lowest concentration of SARS-CoV-2 viral copies this assay can detect is 131 copies/mL. A negative result does not preclude SARS-Cov-2 infection and should not be used as the sole basis for treatment or other patient management decisions. A negative result may occur with  improper specimen collection/handling, submission of specimen other than nasopharyngeal swab, presence of viral mutation(s) within the areas targeted by this assay, and inadequate number of viral copies (<131 copies/mL). A negative result must be combined with clinical observations, patient history, and epidemiological information. The expected result is Negative.  Fact Sheet for Patients:  https://www.moore.com/  Fact Sheet for Healthcare Providers:  https://www.young.biz/  This test is no t yet approved or cleared by the [place] FDA and  has been authorized for detection and/or diagnosis of SARS-CoV-2 by FDA under an Emergency Use Authorization (EUA). This EUA will remain  in effect (meaning this test can be used) for the duration of the COVID-19 declaration under Section 564(b)(1) of the Act, 21 U.S.C. section 360bbb-3(b)(1), unless the authorization is terminated or revoked sooner.     Influenza A by PCR NEGATIVE NEGATIVE Final   Influenza B by PCR NEGATIVE NEGATIVE Final    Comment: (NOTE) The Xpert Xpress SARS-CoV-2/FLU/RSV assay is intended as an aid in  the diagnosis of influenza from Nasopharyngeal swab specimens and  should not be used as  a sole basis for treatment. Nasal washings and  aspirates are unacceptable for Xpert Xpress SARS-CoV-2/FLU/RSV  testing.  Fact Sheet for Patients: https://www.moore.com/  Fact Sheet for Healthcare Providers: https://www.young.biz/  This test  is not yet approved or cleared by the [place] and  has been authorized for detection and/or diagnosis of SARS-CoV-2 by  FDA under an Emergency Use Authorization (EUA). This EUA will remain  in effect (meaning this test can be used) for the duration of the  Covid-19 declaration under Section 564(b)(1) of the Act, 21  U.S.C. section 360bbb-3(b)(1), unless the authorization is  terminated or revoked. Performed at [facility], [address], [place], [place] [address]    Time coordinating discharge:   SIGNED:  Standley Dakins, MD  [facility] 12/02/2019, 1:55 PM How to contact the [facility] Attending or Consulting provider 7A - 7P or covering provider during after hours 7P -7A, for this patient?  1. Check the care team in [facility] and look for a) attending/consulting [facility] provider listed and b) the [facility] team listed 2. Log into [email or web address] and use [facility] universal password to access. If you do not have the password, please contact the hospital operator. 3. Locate the [facility] provider you are looking for under [facility] and page to a number that you can be directly reached. 4. If you still have difficulty reaching the provider, please page the [facility] (Director on Call) for the Hospitalists listed on amion for assistance.

## 2019-12-02 NOTE — Progress Notes (Signed)
  Echocardiogram 2D Echocardiogram has been performed.  Augustine Radar 12/02/2019, 12:07 PM

## 2019-12-02 NOTE — Discharge Instructions (Signed)
Atrial Fibrillation  Atrial fibrillation is a type of heartbeat that is irregular or fast. If you have this condition, your heart beats without any order. This makes it hard for your heart to pump blood in a normal way. Atrial fibrillation may come and go, or it may become a long-lasting problem. If this condition is not treated, it can put you at higher risk for stroke, heart failure, and other heart problems. What are the causes? This condition may be caused by diseases that damage the heart. They include:  High blood pressure.  Heart failure.  Heart valve disease.  Heart surgery. Other causes include:  Diabetes.  Thyroid disease.  Being overweight.  Kidney disease. Sometimes the cause is not known. What increases the risk? You are more likely to develop this condition if:  You are older.  You smoke.  You exercise often and very hard.  You have a family history of this condition.  You are a man.  You use drugs.  You drink a lot of alcohol.  You have lung conditions, such as emphysema, pneumonia, or COPD.  You have sleep apnea. What are the signs or symptoms? Common symptoms of this condition include:  A feeling that your heart is beating very fast.  Chest pain or discomfort.  Feeling short of breath.  Suddenly feeling light-headed or weak.  Getting tired easily during activity.  Fainting.  Sweating. In some cases, there are no symptoms. How is this treated? Treatment for this condition depends on underlying conditions and how you feel when you have atrial fibrillation. They include:  Medicines to: ? Prevent blood clots. ? Treat heart rate or heart rhythm problems.  Using devices, such as a pacemaker, to correct heart rhythm problems.  Doing surgery to remove the part of the heart that sends bad signals.  Closing an area where clots can form in the heart (left atrial appendage). In some cases, your doctor will treat other underlying  conditions. Follow these instructions at home: Medicines  Take over-the-counter and prescription medicines only as told by your doctor.  Do not take any new medicines without first talking to your doctor.  If you are taking blood thinners: ? Talk with your doctor before you take any medicines that have aspirin or NSAIDs, such as ibuprofen, in them. ? Take your medicine exactly as told by your doctor. Take it at the same time each day. ? Avoid activities that could hurt or bruise you. Follow instructions about how to prevent falls. ? Wear a bracelet that says you are taking blood thinners. Or, carry a card that lists what medicines you take. Lifestyle      Do not use any products that have nicotine or tobacco in them. These include cigarettes, e-cigarettes, and chewing tobacco. If you need help quitting, ask your doctor.  Eat heart-healthy foods. Talk with your doctor about the right eating plan for you.  Exercise regularly as told by your doctor.  Do not drink alcohol.  Lose weight if you are overweight.  Do not use drugs, including cannabis. General instructions  If you have a condition that causes breathing to stop for a short period of time (apnea), treat it as told by your doctor.  Keep a healthy weight. Do not use diet pills unless your doctor says they are safe for you. Diet pills may make heart problems worse.  Keep all follow-up visits as told by your doctor. This is important. Contact a doctor if:  You notice a change   in the speed, rhythm, or strength of your heartbeat.  You are taking a blood-thinning medicine and you get more bruising.  You get tired more easily when you move or exercise.  You have a sudden change in weight. Get help right away if:   You have pain in your chest or your belly (abdomen).  You have trouble breathing.  You have side effects of blood thinners, such as blood in your vomit, poop (stool), or pee (urine), or bleeding that cannot  stop.  You have any signs of a stroke. "BE FAST" is an easy way to remember the main warning signs: ? B - Balance. Signs are dizziness, sudden trouble walking, or loss of balance. ? E - Eyes. Signs are trouble seeing or a change in how you see. ? F - Face. Signs are sudden weakness or loss of feeling in the face, or the face or eyelid drooping on one side. ? A - Arms. Signs are weakness or loss of feeling in an arm. This happens suddenly and usually on one side of the body. ? S - Speech. Signs are sudden trouble speaking, slurred speech, or trouble understanding what people say. ? T - Time. Time to call emergency services. Write down what time symptoms started.  You have other signs of a stroke, such as: ? A sudden, very bad headache with no known cause. ? Feeling like you may vomit (nausea). ? Vomiting. ? A seizure. These symptoms may be an emergency. Do not wait to see if the symptoms will go away. Get medical help right away. Call your local emergency services (911 in the [place] Do not drive yourself to the hospital. Summary  Atrial fibrillation is a type of heartbeat that is irregular or fast.  You are at higher risk of this condition if you smoke, are older, have diabetes, or are overweight.  Follow your doctor's instructions about medicines, diet, exercise, and follow-up visits.  Get help right away if you have signs or symptoms of a stroke.  Get help right away if you cannot catch your breath, or you have chest pain or discomfort. This information is not intended to replace advice given to you by your health care provider. Make sure you discuss any questions you have with your health care provider. Document Revised: 07/14/2018 Document Reviewed: 07/14/2018 Elsevier Patient Education  2020 [employer]    IMPORTANT INFORMATION: PAY CLOSE ATTENTION   PHYSICIAN DISCHARGE INSTRUCTIONS  Follow with Primary care provider  Pearson Grippe, MD  and other consultants as instructed by  your Hospitalist Physician  SEEK MEDICAL CARE OR RETURN TO EMERGENCY ROOM IF SYMPTOMS COME BACK, WORSEN OR NEW PROBLEM DEVELOPS   Please note: You were cared for by a hospitalist during your hospital stay. Every effort will be made to forward records to your primary care provider.  You can request that your primary care provider send for your hospital records if they have not received them.  Once you are discharged, your primary care physician will handle any further medical issues. Please note that NO REFILLS for any discharge medications will be authorized once you are discharged, as it is imperative that you return to your primary care physician (or establish a relationship with a primary care physician if you do not have one) for your post hospital discharge needs so that they can reassess your need for medications and monitor your lab values.  Please get a complete blood count and chemistry panel checked by your Primary MD at  your next visit, and again as instructed by your Primary MD.  Get Medicines reviewed and adjusted: Please take all your medications with you for your next visit with your Primary MD  Laboratory/radiological data: Please request your Primary MD to go over all hospital tests and procedure/radiological results at the follow up, please ask your primary care provider to get all Hospital records sent to his/her office.  In some cases, they will be blood work, cultures and biopsy results pending at the time of your discharge. Please request that your primary care provider follow up on these results.  If you are diabetic, please bring your blood sugar readings with you to your follow up appointment with primary care.    Please call and make your follow up appointments as soon as possible.    Also Note the following: If you experience worsening of your admission symptoms, develop shortness of breath, life threatening emergency, suicidal or homicidal thoughts you must seek  medical attention immediately by calling 911 or calling your MD immediately  if symptoms less severe.  You must read complete instructions/literature along with all the possible adverse reactions/side effects for all the Medicines you take and that have been prescribed to you. Take any new Medicines after you have completely understood and accpet all the possible adverse reactions/side effects.   Do not drive when taking Pain medications or sleeping medications (Benzodiazepines)  Do not take more than prescribed Pain, Sleep and Anxiety Medications. It is not advisable to combine anxiety,sleep and pain medications without talking with your primary care practitioner  Special Instructions: If you have smoked or chewed Tobacco  in the last 2 yrs please stop smoking, stop any regular Alcohol  and or any Recreational drug use.  Wear Seat belts while driving.  Do not drive if taking any narcotic, mind altering or controlled substances or recreational drugs or alcohol.

## 2020-06-25 ENCOUNTER — Other Ambulatory Visit: Payer: Self-pay

## 2020-06-25 ENCOUNTER — Encounter (HOSPITAL_COMMUNITY): Payer: Self-pay | Admitting: Emergency Medicine

## 2020-06-25 ENCOUNTER — Emergency Department (HOSPITAL_COMMUNITY)
Admission: EM | Admit: 2020-06-25 | Discharge: 2020-06-25 | Disposition: A | Discharge status: Home / Self Care | Payer: Medicare Other | Attending: Emergency Medicine | Admitting: Emergency Medicine

## 2020-06-25 DIAGNOSIS — S30861A Insect bite (nonvenomous) of abdominal wall, initial encounter: Secondary | ICD-10-CM | POA: Diagnosis present

## 2020-06-25 DIAGNOSIS — Z7982 Long term (current) use of aspirin: Secondary | ICD-10-CM | POA: Diagnosis not present

## 2020-06-25 DIAGNOSIS — W57XXXA Bitten or stung by nonvenomous insect and other nonvenomous arthropods, initial encounter: Secondary | ICD-10-CM | POA: Diagnosis not present

## 2020-06-25 DIAGNOSIS — F17210 Nicotine dependence, cigarettes, uncomplicated: Secondary | ICD-10-CM | POA: Diagnosis not present

## 2020-06-25 MED ORDER — DOXYCYCLINE HYCLATE 100 MG PO CAPS: 100.0000 mg | ORAL_CAPSULE | Freq: Two times a day (BID) | ORAL | 0 refills | Status: AC

## 2020-06-25 MED ORDER — DIPHENHYDRAMINE HCL 25 MG PO CAPS
25.0000 mg | ORAL_CAPSULE | Freq: Once | ORAL | Status: AC
  Administered 2020-06-25: 25 mg via ORAL
  Filled 2020-06-25: qty 1

## 2020-06-25 NOTE — ED Triage Notes (Signed)
Pt here with c/o bug bites to body. States he worked outside yesterday and pulled some ticks off of him. Pt c/o itching to these sites.

## 2020-06-25 NOTE — ED Provider Notes (Signed)
[facility] EMERGENCY DEPARTMENT Provider Note   CSN: [id number] Arrival date & time: 06/25/20  0134     History Chief Complaint  Patient presents with  . Bug Bites    Maurice Wells is a 70 y.o. male.  The history is provided by the patient.  Rash Severity:  Mild Onset quality:  Gradual Timing:  Intermittent Progression:  Unchanged Chronicity:  New Relieved by:  Nothing Worsened by:  Nothing Associated symptoms: no fever   Patient reports that he pulled a tick off his abdominal wall yesterday.  Since that time he has had scattered rash throughout his body that is itching. No other complaints       Patient Active Problem List   Diagnosis Date Noted  . New onset atrial fibrillation (HCC) 12/02/2019  . Syncope and collapse 12/01/2019  . Polysubstance abuse (HCC) 12/01/2019  . Atrial fibrillation with RVR (HCC) 12/01/2019    History reviewed. No pertinent surgical history.     History reviewed. No pertinent family history.  Social History   Tobacco Use  . Smoking status: Current Every Day Smoker    Packs/day: 0.50    Types: Cigarettes  . Smokeless tobacco: Never Used  Substance Use Topics  . Alcohol use: Yes    Alcohol/week: 5.0 standard drinks    Types: 3 Cans of beer, 2 Shots of liquor per week  . Drug use: No    Home Medications Prior to Admission medications   Medication Sig Start Date End Date Taking? Authorizing Provider  doxycycline (VIBRAMYCIN) 100 MG capsule Take 1 capsule (100 mg total) by mouth 2 (two) times daily. One po bid x 7 days 06/25/20  Yes Zadie Rhine, MD  aspirin EC 81 MG EC tablet Take 1 tablet (81 mg total) by mouth daily. Swallow whole. 12/03/19   Johnson, Clanford L, MD  diltiazem (CARDIZEM) 30 MG tablet Take 1 tablet (30 mg total) by mouth every 12 (twelve) hours as needed (palpitation, rapid heart rate). 12/02/19   Johnson, Clanford L, MD  folic acid (FOLVITE) 1 MG tablet Take 1 tablet (1 mg total) by mouth daily. 12/03/19    Johnson, Clanford L, MD  Multiple Vitamin (MULTIVITAMIN WITH MINERALS) TABS tablet Take 1 tablet by mouth daily. 12/03/19   Johnson, Clanford L, MD  thiamine 100 MG tablet Take 1 tablet (100 mg total) by mouth daily. 12/03/19   Cleora Fleet, MD    Allergies    Patient has no known allergies.  Review of Systems   Review of Systems  Constitutional: Negative for fever.  Skin: Positive for rash.    Physical Exam Updated Vital Signs BP 118/82 (BP Location: Left Arm)   Pulse 93   Temp 98.3 F (36.8 C) (Oral)   Ht 1.778 m (5\' 10" )   Wt 68 kg   SpO2 98%   BMI 21.52 kg/m   Physical Exam CONSTITUTIONAL: Thin appearing, no acute distress HEAD: Normocephalic/atraumatic EYES: EOMI ENMT: Mucous membranes moist, no angioedema, no oral lesions NECK: supple no meningeal signs LUNGS: no apparent distress ABDOMEN: soft NEURO: Pt is awake/alert/appropriate, moves all extremitiesx4.  No facial droop.  Patient walking around the room EXTREMITIES full ROM SKIN: Scattered papules to his extremities and back. PSYCH: Mildly anxious ED Results / Procedures / Treatments   Labs (all labs ordered are listed, but only abnormal results are displayed) Labs Reviewed - No data to display  EKG None  Radiology No results found.  Procedures Procedures   Medications Ordered in ED  Medications  diphenhydrAMINE (BENADRYL) capsule 25 mg (has no administration in time range)    ED Course  I have reviewed the triage vital signs and the nursing notes.  MDM Rules/Calculators/A&P                          Started on doxycycline for tick bite.  He can also take Benadryl No other signs of allergic reaction Final Clinical Impression(s) / ED Diagnoses Final diagnoses:  Tick bite of abdominal wall, initial encounter    Rx / DC Orders ED Discharge Orders         Ordered    doxycycline (VIBRAMYCIN) 100 MG capsule  2 times daily        06/25/20 4034           Zadie Rhine,  MD 06/25/20 [phone]

## 2021-05-30 IMAGING — CT CT HEAD W/O CM
3 series · 15 of 47 positions shown, 18 images · non-contrast
Comparison: None.

CLINICAL DATA: Patient found unresponsive in front yard. Fell off
ladder.

EXAM:
CT HEAD WITHOUT CONTRAST
CT CERVICAL SPINE WITHOUT CONTRAST
TECHNIQUE: Multidetector CT imaging of the head and cervical spine was
performed following the standard protocol without intravenous
contrast. Multiplanar CT image reconstructions of the cervical spine
were also generated.

[Series 3: head w o · axial · 0.42mm/px · z∈[+12,+142]mm · 9 of 32 slices shown, 12 images]
[im 3/32  brain]
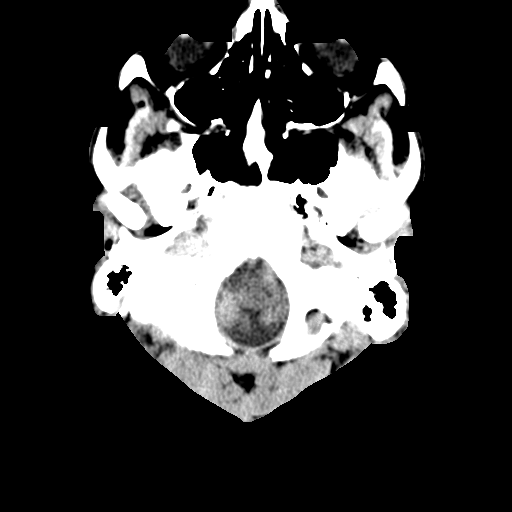
[im 3/32  bone]
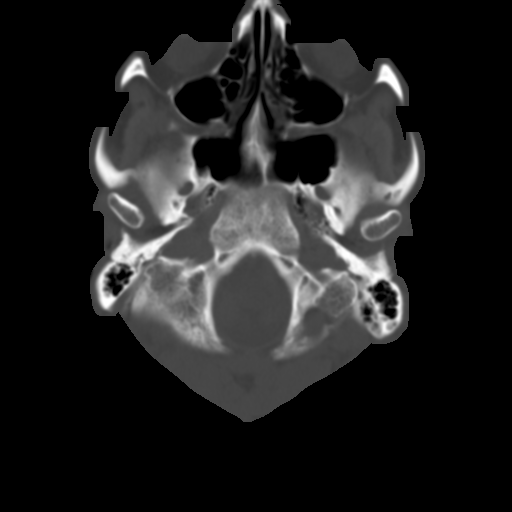
[im 6/32  brain]
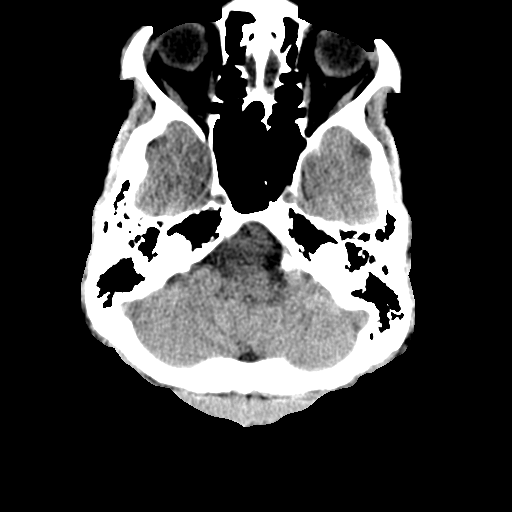
[im 9/32  brain]
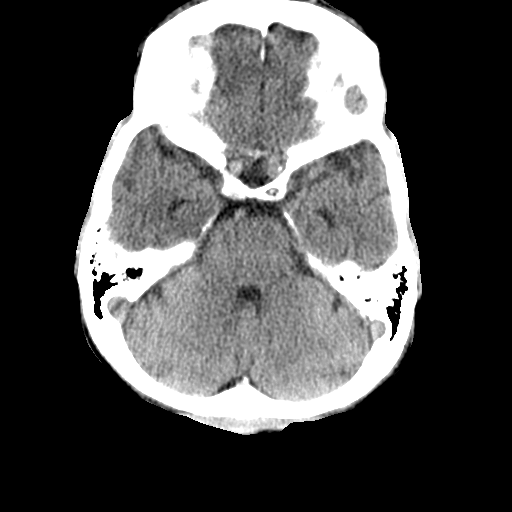
[im 12/32  brain]
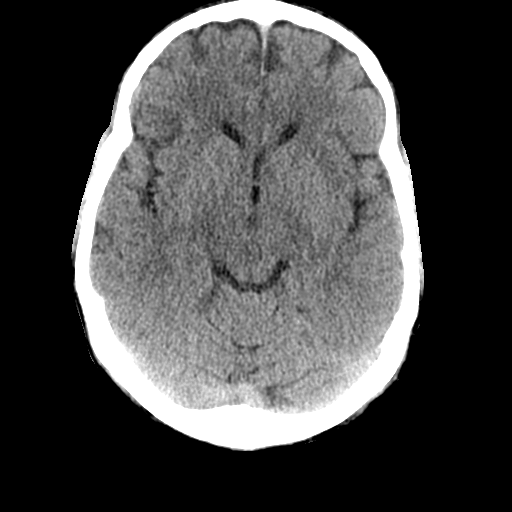
[im 17/32  brain]
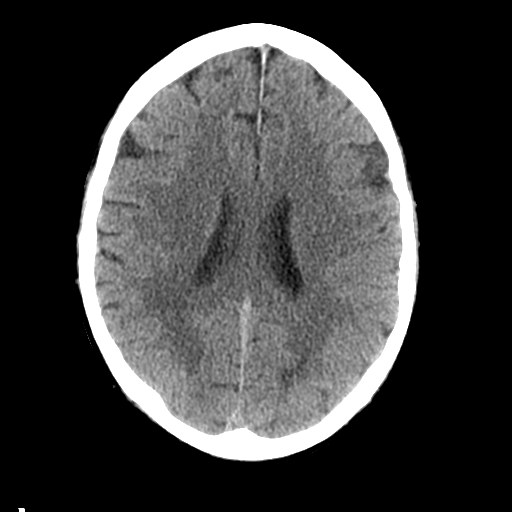
[im 17/32  bone]
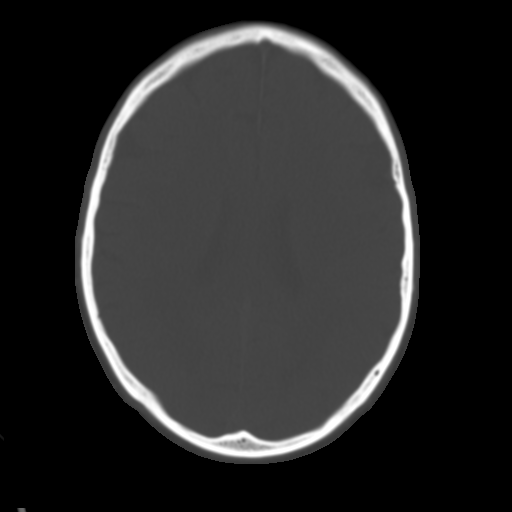
[im 20/32  brain]
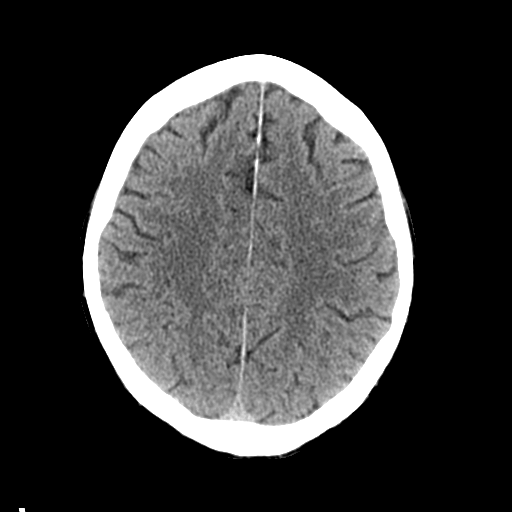
[im 23/32  brain]
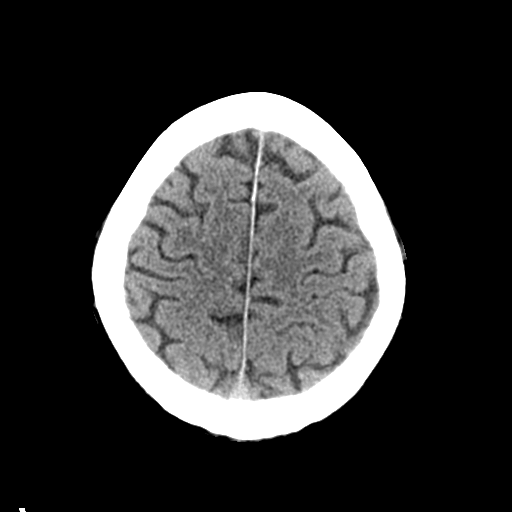
[im 26/32  brain]
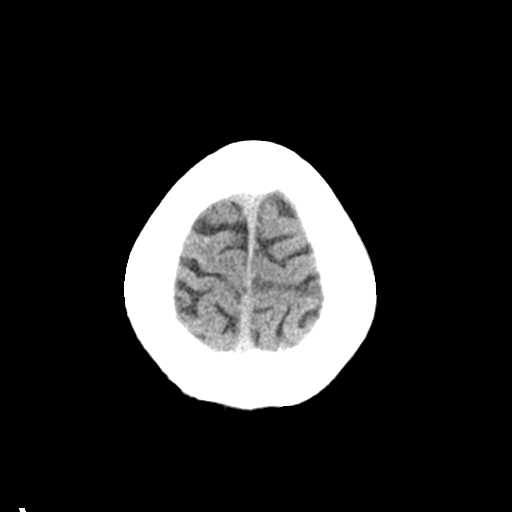
[im 29/32  brain]
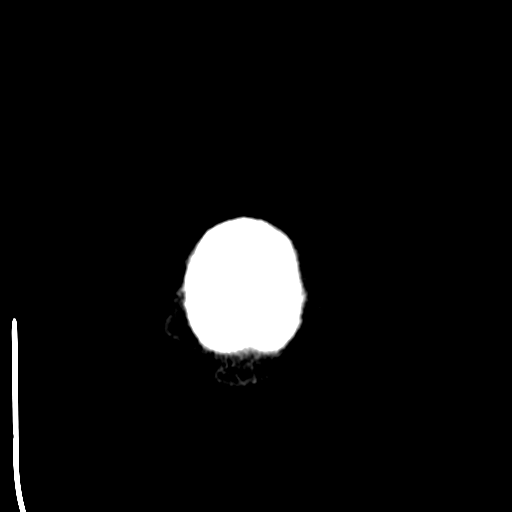
[im 29/32  bone]
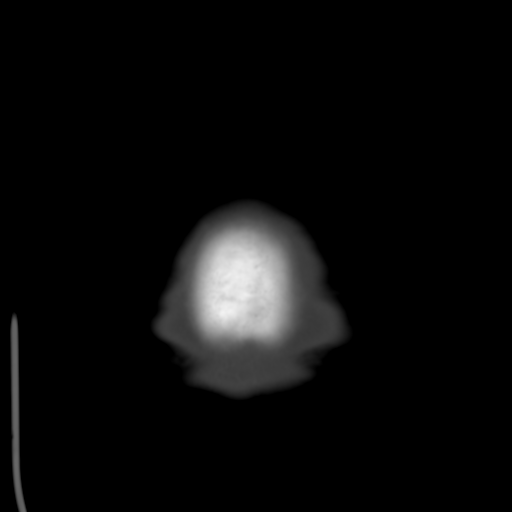

[Series 5: sagittal soft · sagittal · 0.33mm/px · 3 of 55 slices shown]
[im 19/55  brain]
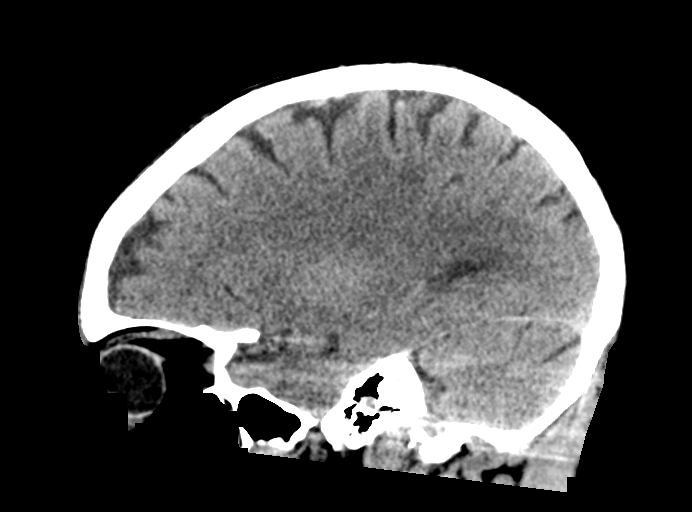
[im 28/55  brain]
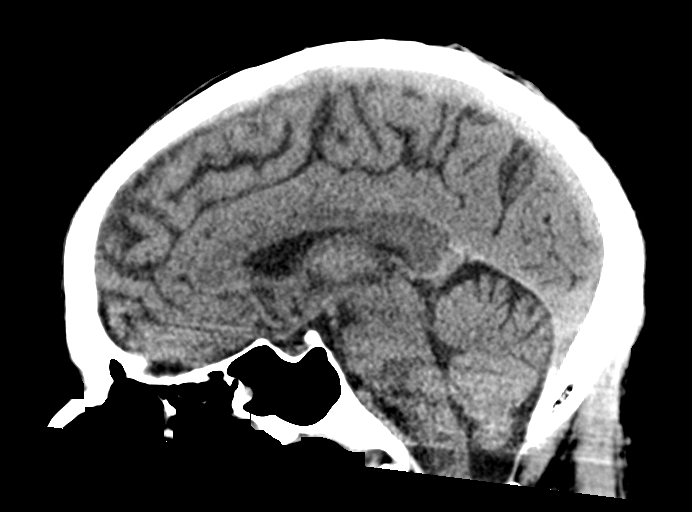
[im 37/55  brain]
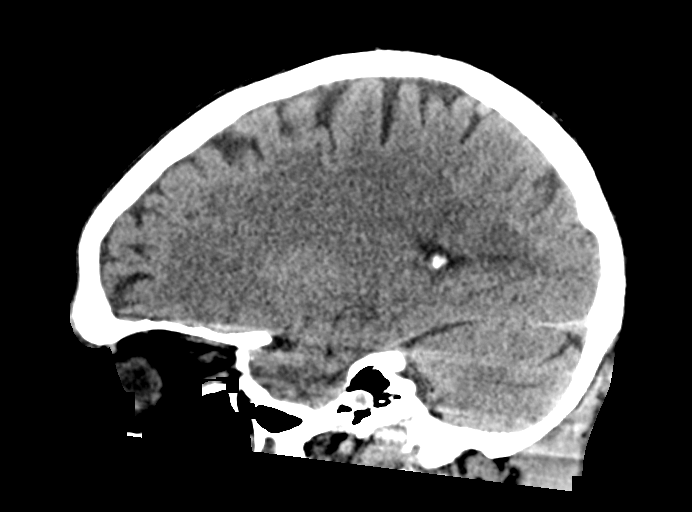

[Series 6: coronal soft · coronal · 0.31mm/px · 3 of 71 slices shown]
[im 24/71  brain]
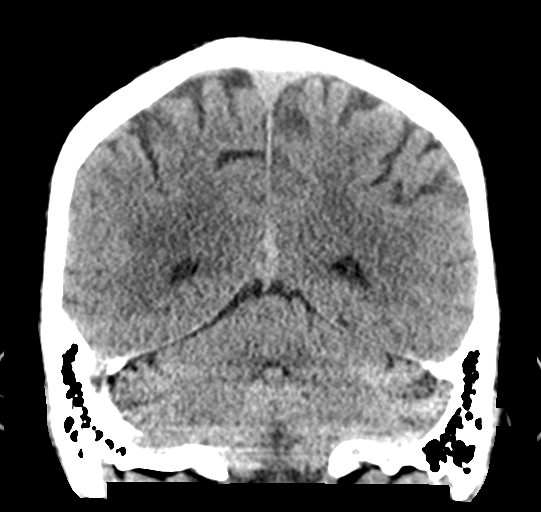
[im 32/71  brain]
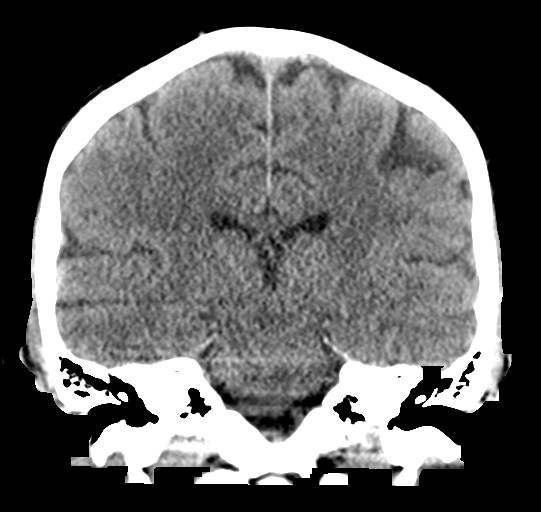
[im 39/71  brain]
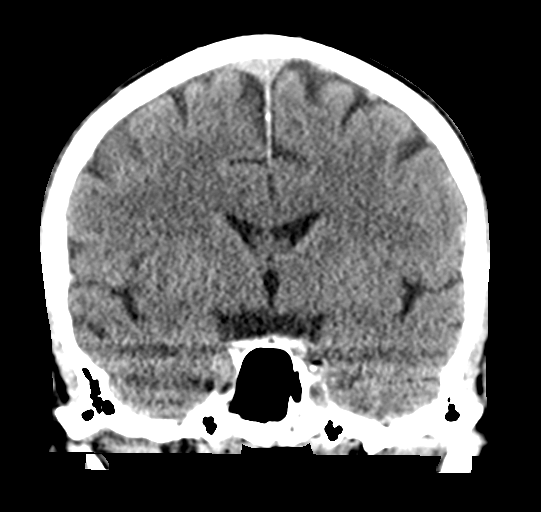

[15 of 47 positions shown; findings below may reference images not displayed]

FINDINGS: CT HEAD FINDINGS

Brain: No evidence of acute infarction, hemorrhage, hydrocephalus,
extra-axial collection or mass lesion/mass effect. Mild
periventricular white matter hypodensity, likely the sequela of
chronic microvascular ischemic disease.

Vascular: Calcific atherosclerosis.

Skull: No acute fracture.

Sinuses/Orbits: Minimal mucosal thickening of the left sphenoid
sinus and scattered ethmoid air cells. Otherwise, clear sinuses.
Unremarkable orbits.

Other:  No mastoid effusions.

CT CERVICAL SPINE FINDINGS

Alignment: Normal.

Skull base and vertebrae: No acute fracture. No primary bone lesion
or focal pathologic process.

Soft tissues and spinal canal: No prevertebral fluid or swelling. No
visible canal hematoma.

Disc levels:  Mild multilevel degenerative disc disease.

Upper chest: Emphysema without acute abnormality.

Other: None.
IMPRESSION: 1. No evidence of acute intracranial abnormality.
2. No evidence of acute cervical spine fracture or traumatic
malalignment.
